# Patient Record
Sex: Female | Born: 1985 | Race: White | Hispanic: No | Marital: Married | Smoking: Former smoker
Health system: Southern US, Community
[De-identification: ages and names within clinical notes are randomized; demographics above are authoritative.]

## PROBLEM LIST (undated history)

## (undated) ENCOUNTER — Inpatient Hospital Stay: Payer: Self-pay

## (undated) DIAGNOSIS — F410 Panic disorder [episodic paroxysmal anxiety] without agoraphobia: Secondary | ICD-10-CM

## (undated) DIAGNOSIS — Z8614 Personal history of Methicillin resistant Staphylococcus aureus infection: Secondary | ICD-10-CM

## (undated) DIAGNOSIS — G40A09 Absence epileptic syndrome, not intractable, without status epilepticus: Secondary | ICD-10-CM

## (undated) DIAGNOSIS — R569 Unspecified convulsions: Secondary | ICD-10-CM

## (undated) DIAGNOSIS — K219 Gastro-esophageal reflux disease without esophagitis: Secondary | ICD-10-CM

## (undated) DIAGNOSIS — Z8659 Personal history of other mental and behavioral disorders: Secondary | ICD-10-CM

## (undated) DIAGNOSIS — F909 Attention-deficit hyperactivity disorder, unspecified type: Secondary | ICD-10-CM

## (undated) DIAGNOSIS — D649 Anemia, unspecified: Secondary | ICD-10-CM

## (undated) HISTORY — PX: OVARIAN CYST REMOVAL: SHX89

## (undated) HISTORY — PX: NO PAST SURGERIES: SHX2092

---
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
## 2006-01-04 ENCOUNTER — Emergency Department: Payer: Self-pay | Admitting: Emergency Medicine
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
## 2007-10-06 ENCOUNTER — Emergency Department: Payer: Self-pay | Admitting: Emergency Medicine

## 2010-08-15 DIAGNOSIS — Z8614 Personal history of Methicillin resistant Staphylococcus aureus infection: Secondary | ICD-10-CM

## 2010-08-15 HISTORY — DX: Personal history of Methicillin resistant Staphylococcus aureus infection: Z86.14

## 2011-12-04 ENCOUNTER — Emergency Department: Payer: Self-pay | Admitting: Emergency Medicine

## 2012-01-23 ENCOUNTER — Ambulatory Visit: Payer: Self-pay | Admitting: Obstetrics and Gynecology

## 2012-07-27 IMAGING — US ULTRASOUND RIGHT BREAST
1 series · 14 of 14 positions shown · non-contrast
Comparison: none

REASON FOR EXAM: RT BR LUMP 9 OCLOCK
COMMENTS:

[Series 1: ultrasound right breast · 0.08mm/px · 14 of 14 slices shown]
[im 1/14]
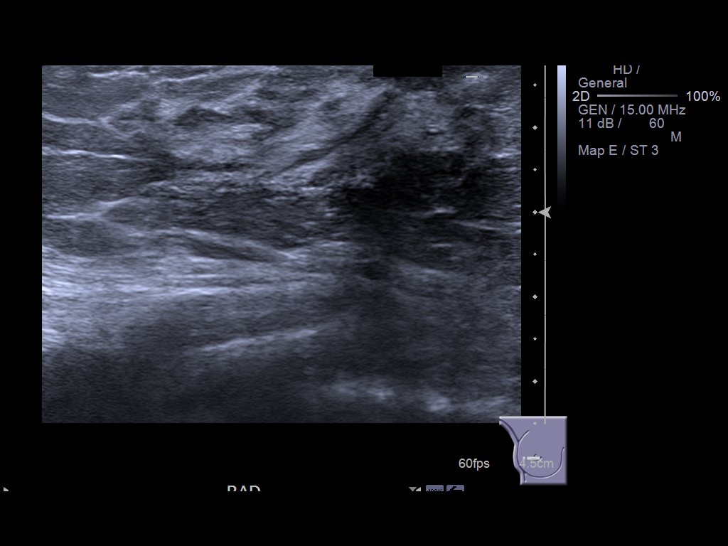
[im 2/14]
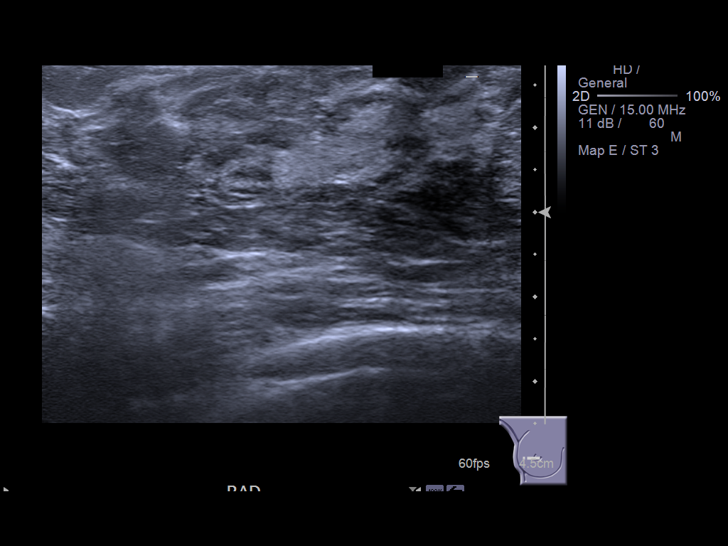
[im 3/14]
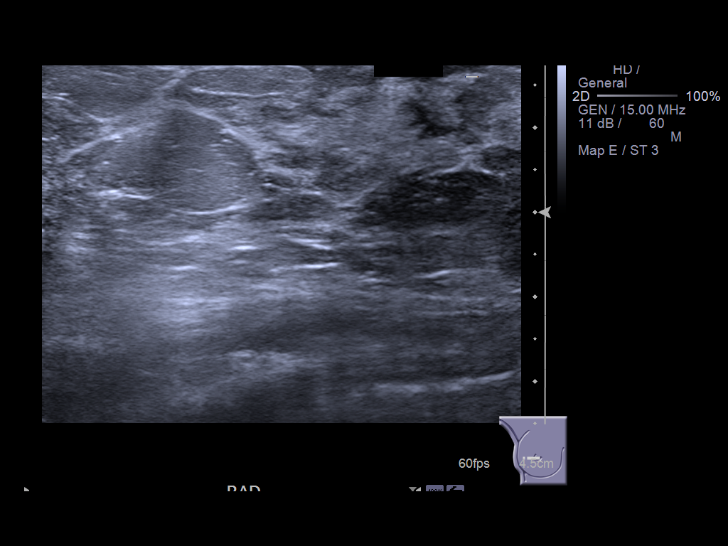
[im 4/14]
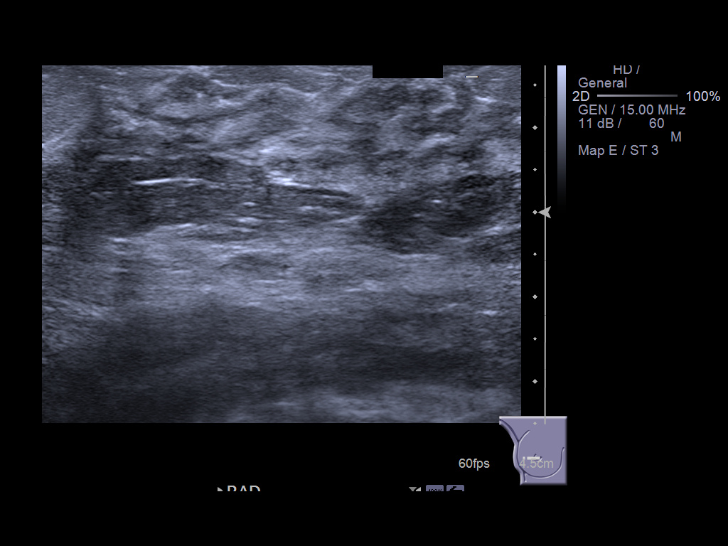
[im 5/14]
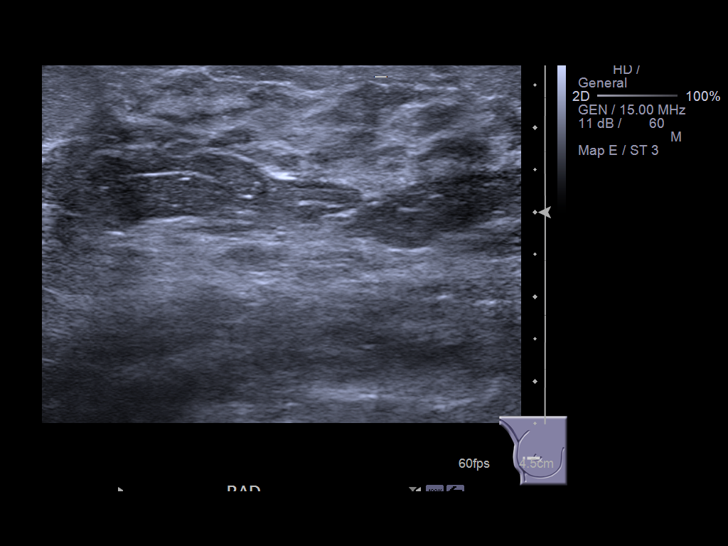
[im 6/14]
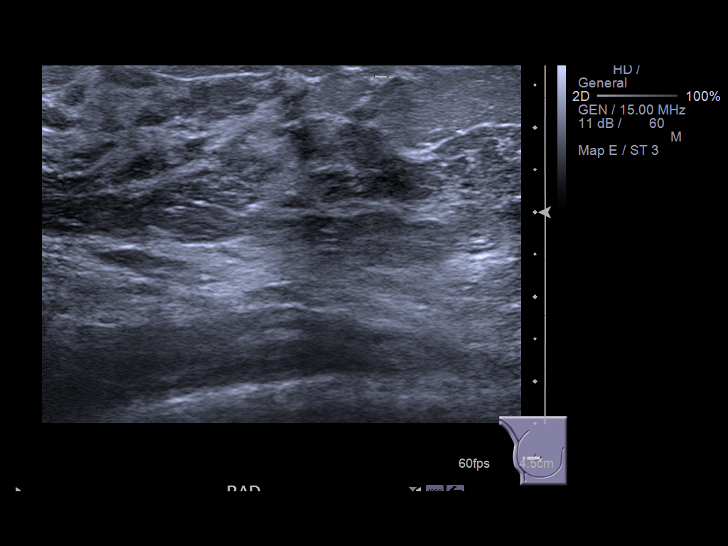
[im 7/14]
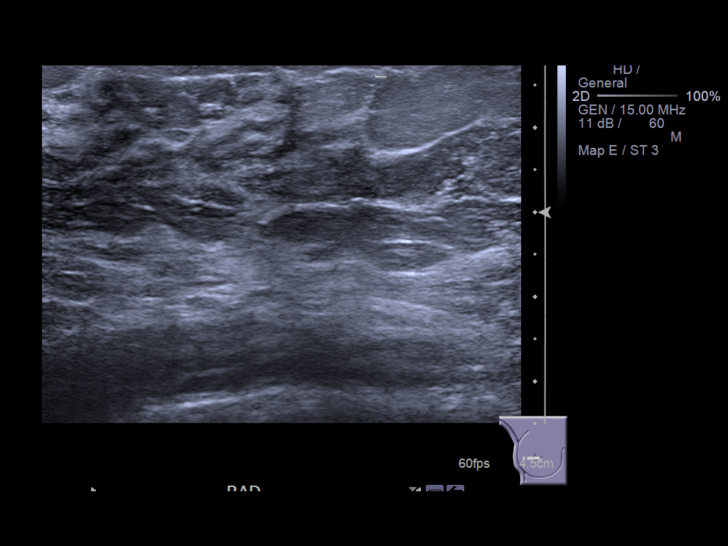
[im 8/14]
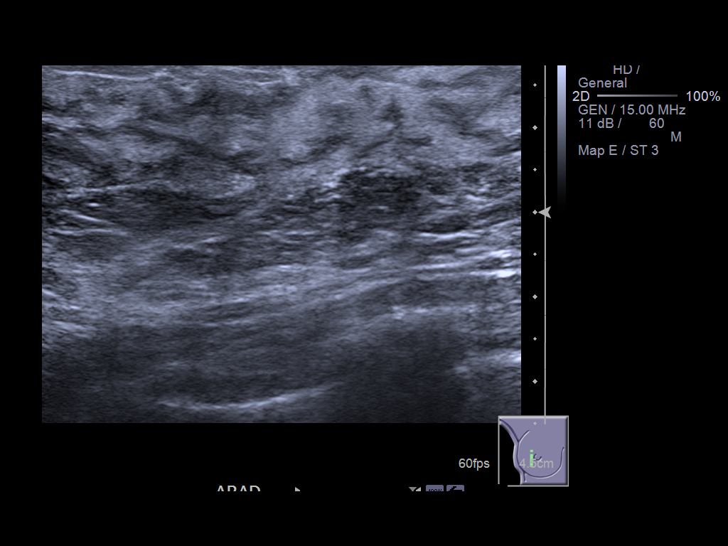
[im 9/14]
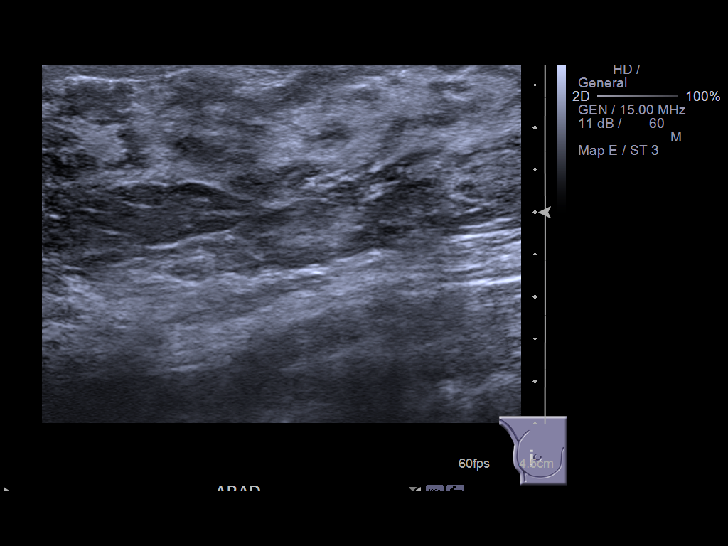
[im 10/14]
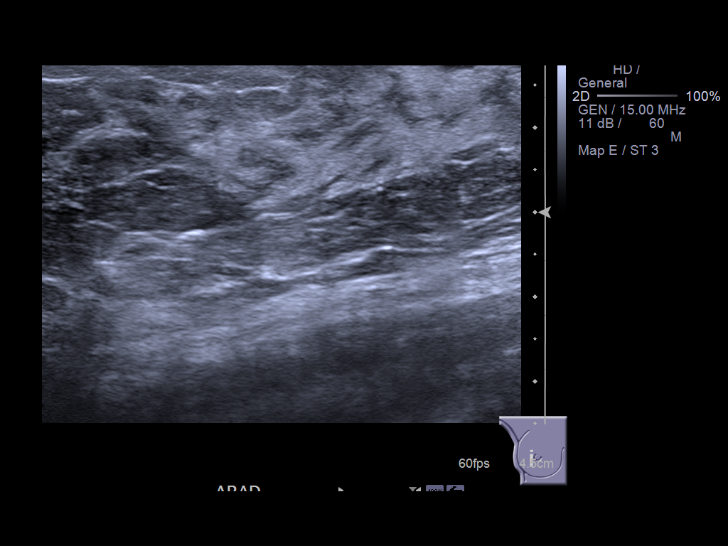
[im 11/14]
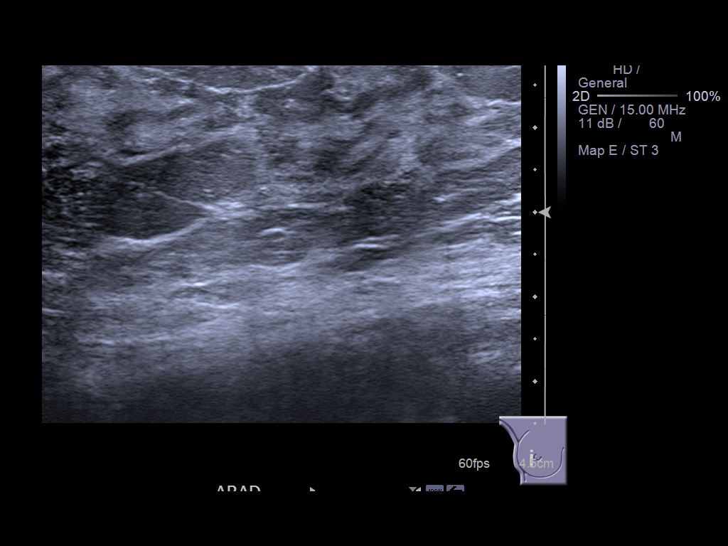
[im 12/14]
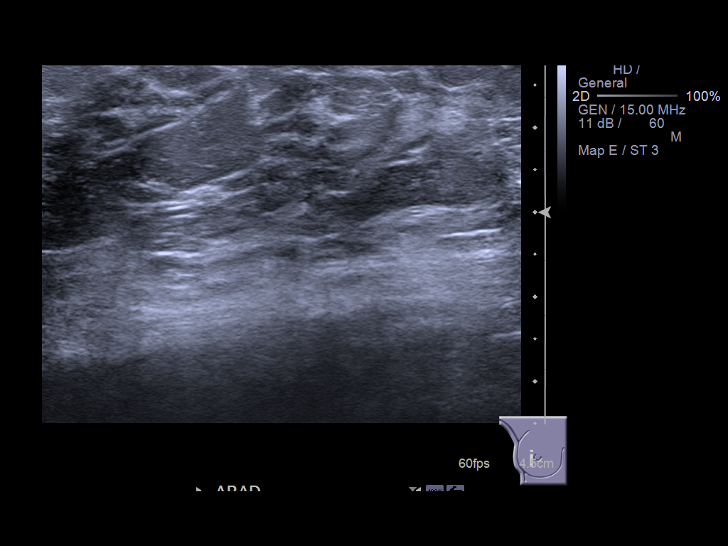
[im 13/14]
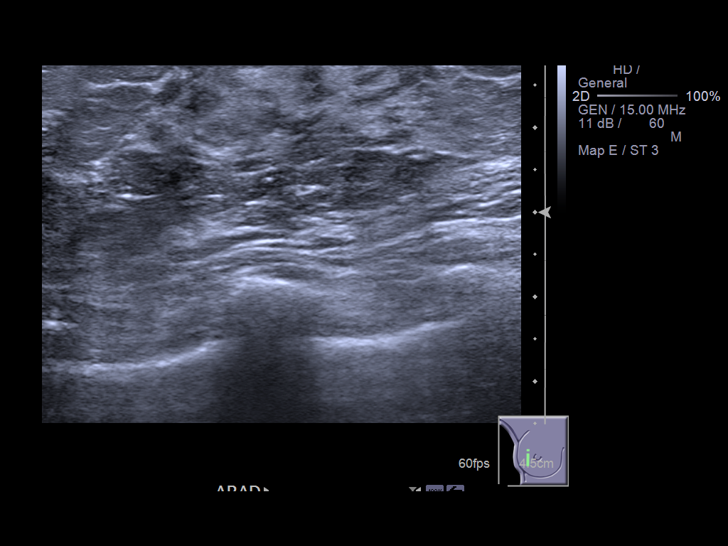
[im 14/14]
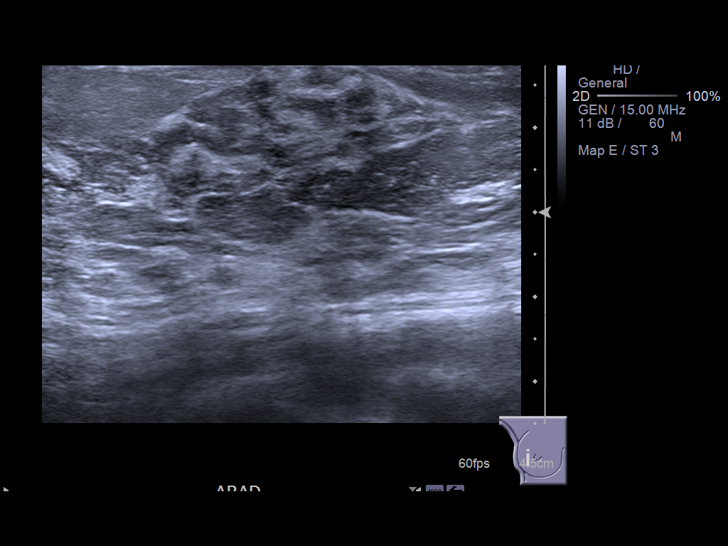

[14 of 14 positions shown; findings below may reference images not displayed]

PROCEDURE:     US  - US BREAST RIGHT  - January 23, 2012 [DATE]

RESULT:     Comparison: None.

Technique and findings: Multiple grayscale images were obtained of the
lateral right breast at approximately [DATE] secondary to reported palpable
amount in this region. There is some dense fibroglandular tissue in this
region. However, no mass or suspicious shadowing is identified.
IMPRESSION: BIRADS 1. Negative. Recommend further evaluation and management of the
patient's reported palpable abnormality be based and clinical grounds.

## 2014-06-16 LAB — OB RESULTS CONSOLE RUBELLA ANTIBODY, IGM: RUBELLA: IMMUNE

## 2014-06-16 LAB — OB RESULTS CONSOLE VARICELLA ZOSTER ANTIBODY, IGG: Varicella: IMMUNE

## 2014-06-16 LAB — OB RESULTS CONSOLE GC/CHLAMYDIA
Chlamydia: NEGATIVE
Gonorrhea: NEGATIVE

## 2014-06-16 LAB — OB RESULTS CONSOLE RPR: RPR: NONREACTIVE

## 2014-06-16 LAB — OB RESULTS CONSOLE HEPATITIS B SURFACE ANTIGEN: Hepatitis B Surface Ag: NEGATIVE

## 2014-06-16 LAB — OB RESULTS CONSOLE HIV ANTIBODY (ROUTINE TESTING): HIV: NONREACTIVE

## 2014-07-28 ENCOUNTER — Encounter: Payer: Self-pay | Admitting: Obstetrics and Gynecology

## 2014-08-15 NOTE — L&D Delivery Note (Signed)
Delivery Note At 3:09 AM a viable female was delivered via Vaginal, Spontaneous Delivery (Vertex).  APGAR: 8, 9; weight  8#9oz  Placenta status: delivered intact spontaneously  Cord: 3 vessels with the following complications: .  Cord pH: not needed  Anesthesia:  None Episiotomy:  None Lacerations:  2nd degree, periurethral Suture Repair: 2.0 vicryl Est. Blood Loss (mL):  300 cc  Mom to postpartum.  Baby to Couplet care / Skin to Skin.  Katie Marks 01/29/2015, 4:14 AM

## 2014-10-13 ENCOUNTER — Ambulatory Visit: Payer: Self-pay | Admitting: Obstetrics and Gynecology

## 2014-10-14 ENCOUNTER — Ambulatory Visit
Admit: 2014-10-14 | Disposition: A | Discharge status: Home / Self Care | Payer: Self-pay | Attending: Obstetrics and Gynecology | Admitting: Obstetrics and Gynecology

## 2014-11-14 ENCOUNTER — Ambulatory Visit
Admit: 2014-11-14 | Disposition: A | Discharge status: Home / Self Care | Payer: Self-pay | Attending: Obstetrics and Gynecology | Admitting: Obstetrics and Gynecology

## 2014-12-06 NOTE — Consult Note (Signed)
Referral Information:  Reason for Referral h/o seizure disorder, ADHD  now pregnant   Referring Physician [facility]   Prenatal Hx 28 yo G1 P0 at 13 5/7 weeks LMP 04/24/14 with EDC of 01/29/2015 - met with the genetic counselor -pt has been off of meds, tegretol, since age 16 , has occasionally pauses in her speech that she considers neurological but no recent sz activity . She is able to drive . Not using methylphenidate for ADHD. Feels liek she is doing well overall.   Past Obstetrical Hx Primigravida   Home Medications:  Medication Instructions Status  Prenatal Multivitamins Prenatal Multivitamins oral tablet 1 tab(s) orally once a day Active   Allergies:   No Known Allergies:   Vital Signs/Notes:  Nursing Vital Signs: **Vital Signs.:   14-Dec-15 08:54  Vital Signs Type Routine  Temperature Temperature (F) 99.5  Celsius 37.5  Temperature Source oral  Pulse Pulse 96  Respirations Respirations 18  Systolic BP Systolic BP 108  Diastolic BP (mmHg) Diastolic BP (mmHg) 64  Mean BP 78  Pulse Ox % Pulse Ox % 100  Pulse Ox Activity Level  At rest  Oxygen Delivery Room Air/ 21 %   Perinatal Consult:  LMP 24-Apr-2014   Past Medical History cont'd - Seizure disorder - stoppe dmeds at age 16 - "pauses " in speech no LOC . off tegretol . Dismissed by neurologist  - ADHD - got Rx for meds never took   Soc Hx married   Impression/Recommendations:  Impression IUP at 13  5/7  desires genetic screening h/o seizure d/o off meds x years  h/o ADHD no meds   Recommendations pregnancy too large for First tri screen ( CRL 87.9 exceeds 84 mm cutoff) pt will get tetra screen at 16 weeks, Anatomy scan at 18 weeks at [facility]  If her neuro sxs increase we are happy t o see her again  Given no grand mal szs and no meds we were reassuring to her.   Plan:  Genetic Counseling yes, today    Total Time Spent with Patient 5 minutes no charge   Electronic Signatures: Livingston,  Elizabeth Gresham (MD)  (Signed 14-Dec-15 11:50)  Authored: Referral, Home Medications, Allergies, Vital Signs/Notes, Consult, Impression, Plan, Billing   Last Updated: 14-Dec-15 11:50 by Livingston, Elizabeth Gresham (MD) 

## 2014-12-24 ENCOUNTER — Encounter: Payer: Self-pay | Admitting: *Deleted

## 2014-12-24 ENCOUNTER — Inpatient Hospital Stay
Admission: RE | Admit: 2014-12-24 | Discharge: 2014-12-24 | Disposition: A | Discharge status: Home / Self Care | Payer: No Typology Code available for payment source | Attending: Obstetrics and Gynecology | Admitting: Obstetrics and Gynecology

## 2014-12-24 DIAGNOSIS — O26893 Other specified pregnancy related conditions, third trimester: Secondary | ICD-10-CM | POA: Diagnosis not present

## 2014-12-24 DIAGNOSIS — M545 Low back pain, unspecified: Secondary | ICD-10-CM

## 2014-12-24 HISTORY — DX: Unspecified convulsions: R56.9

## 2014-12-24 NOTE — Progress Notes (Signed)
Discharge teaching completed.  Verbalized understanding.  Discharged in stable and ambulatory condition.   Camelia PhenesHall,Miyani Cronic F, RN

## 2014-12-24 NOTE — OB Triage Note (Signed)
Recvd to labor and delivery complaining of back pain that started yesterday when her hip popped at work.  This morning she felt back pain in the middle of her back that radiates to her leg.

## 2014-12-24 NOTE — OB Triage Provider Note (Signed)
  History     CSN: [id number]  Arrival date and time: 12/24/14 1129   Chief Complaint  Patient presents with  . Back Pain   HPI Comments: Pain began when she twisted yesterday.  Back Pain This is a new problem. The current episode started yesterday. The problem occurs intermittently. The problem has been gradually worsening since onset. The pain is present in the lumbar spine. The quality of the pain is described as shooting. The pain radiates to the right thigh. The pain is moderate. The pain is worse during the day. The symptoms are aggravated by standing and twisting. Stiffness is present at night. Risk factors include pregnancy. She has tried nothing for the symptoms. The treatment provided no relief.    OB History    Gravida Para Term Preterm AB TAB SAB Ectopic Multiple Living   1               Past Medical History  Diagnosis Date  . Seizures     Past Surgical History  Procedure Laterality Date  . No past surgeries      No family history on file.  History  Substance Use Topics  . Smoking status: Never Smoker   . Smokeless tobacco: Not on file  . Alcohol Use: No    Allergies: No Known Allergies  Prescriptions prior to admission  Medication Sig Dispense Refill Last Dose  . Prenatal Vit-Fe Fumarate-FA (PRENATAL MULTIVITAMIN) TABS tablet Take 1 tablet by mouth daily at 12 noon.       Review of Systems  Musculoskeletal: Positive for back pain.       No bowel or bladder sx. No muscle weakness in bilateral lower extremities. No tingling. It does shoot down right leg to mid-thigh.   Physical Exam   Blood pressure 130/76, pulse 84, temperature 98.5 F (36.9 C), temperature source Oral, resp. rate 16, height 5\' 7"  (1.702 m), weight 104.327 kg (230 lb).  Physical Exam  Musculoskeletal:       Right hip: She exhibits normal range of motion, normal strength and no swelling.       Left hip: She exhibits normal range of motion, normal strength, no tenderness and no  bony tenderness.       Right knee: Normal.       Left knee: Normal. She exhibits normal range of motion. No tenderness found.       Right ankle: Normal.       Left ankle: Normal.       Lumbar back: She exhibits tenderness and spasm. She exhibits no bony tenderness, no swelling and no edema.  Neurological:  Reflex Scores:      Patellar reflexes are 1+ on the right side and 1+ on the left side.      Achilles reflexes are 1+ on the right side and 1+ on the left side.   MAU Course  Procedures - None Assessment and Plan  Back pain in pregnancy: Conservative management; no evidence of fracture or discopathy on physical exam. Referral to physiatry, physical therapy and acupuncture offered.  Fetal testing- Reassuring. Reactive NST.  Christeen DouglasBEASLEY, Katie Marks 12/24/2014, 2:28 PM

## 2014-12-24 NOTE — Discharge Instructions (Signed)
Sciatica Sciatica happens when a large nerve, which runs through the joint between the tail bone and the hip bone, is compressed. This can cause shooting pain down the leg and can happen on either side of your body. Sometimes, it may cause weakness and can get worse as pregnancy progresses. The following comfort measures may help you manage the pain:  o Apply ice to the joint in the low back, located at the dimples on either side of the spine. o Ask your provider for instructions for stretches called pelvic tilt exercises. To help prevent sciatica: o Use good posture and lower back support while sitting o Wear shoes with good arch support o Try not to carry heavy bags on the same shoulder all of the time o Practice exercises that strengthen your core or stomach muscles, including yoga, swimming and pelvic tilt exercises.

## 2015-01-19 LAB — OB RESULTS CONSOLE GBS: STREP GROUP B AG: NEGATIVE

## 2015-01-19 LAB — OB RESULTS CONSOLE ANTIBODY SCREEN: ANTIBODY SCREEN: NEGATIVE

## 2015-01-28 ENCOUNTER — Encounter: Payer: Self-pay | Admitting: *Deleted

## 2015-01-28 ENCOUNTER — Inpatient Hospital Stay
Admission: EM | Admit: 2015-01-28 | Discharge: 2015-01-31 | DRG: 775 | Disposition: A | Discharge status: Home / Self Care | Payer: No Typology Code available for payment source | Attending: Obstetrics and Gynecology | Admitting: Obstetrics and Gynecology

## 2015-01-28 DIAGNOSIS — O26893 Other specified pregnancy related conditions, third trimester: Secondary | ICD-10-CM

## 2015-01-28 DIAGNOSIS — Z23 Encounter for immunization: Secondary | ICD-10-CM

## 2015-01-28 DIAGNOSIS — M545 Low back pain: Secondary | ICD-10-CM

## 2015-01-28 DIAGNOSIS — Z3A40 40 weeks gestation of pregnancy: Secondary | ICD-10-CM | POA: Diagnosis present

## 2015-01-28 DIAGNOSIS — Z3483 Encounter for supervision of other normal pregnancy, third trimester: Secondary | ICD-10-CM | POA: Diagnosis present

## 2015-01-28 LAB — TYPE AND SCREEN
ABO/RH(D): A POS
Antibody Screen: NEGATIVE

## 2015-01-28 LAB — OB RESULTS CONSOLE ABO/RH: RH Type: POSITIVE

## 2015-01-28 LAB — CBC
HCT: 32.6 % — ABNORMAL LOW (ref 35.0–47.0)
Hemoglobin: 10.9 g/dL — ABNORMAL LOW (ref 12.0–16.0)
MCH: 28.2 pg (ref 26.0–34.0)
MCHC: 33.4 g/dL (ref 32.0–36.0)
MCV: 84.4 fL (ref 80.0–100.0)
Platelets: 189 10*3/uL (ref 150–440)
RBC: 3.86 MIL/uL (ref 3.80–5.20)
RDW: 15.1 % — AB (ref 11.5–14.5)
WBC: 8.2 10*3/uL (ref 3.6–11.0)

## 2015-01-28 LAB — ABO/RH: ABO/RH(D): A POS

## 2015-01-28 MED ORDER — OXYTOCIN 40 UNITS IN LACTATED RINGERS INFUSION - SIMPLE MED
1.0000 m[IU]/min | INTRAVENOUS | Status: DC
  Administered 2015-01-28: 8 m[IU]/min via INTRAVENOUS
  Administered 2015-01-28: 1 m[IU]/min via INTRAVENOUS
  Administered 2015-01-28: 2 m[IU]/min via INTRAVENOUS
  Administered 2015-01-28: 4 m[IU]/min via INTRAVENOUS
  Administered 2015-01-28: 5 m[IU]/min via INTRAVENOUS
  Administered 2015-01-28: 18 m[IU]/min via INTRAVENOUS

## 2015-01-28 MED ORDER — LACTATED RINGERS IV SOLN: 500.0000 mL | INTRAVENOUS | Status: DC | PRN

## 2015-01-28 MED ORDER — LIDOCAINE HCL (PF) 1 % IJ SOLN
30.0000 mL | INTRAMUSCULAR | Status: AC | PRN
  Administered 2015-01-29: 30 mL via SUBCUTANEOUS
  Filled 2015-01-28: qty 30

## 2015-01-28 MED ORDER — OXYTOCIN BOLUS FROM INFUSION
500.0000 mL | INTRAVENOUS | Status: DC
  Administered 2015-01-29: 500 mL via INTRAVENOUS

## 2015-01-28 MED ORDER — OXYTOCIN 40 UNITS IN LACTATED RINGERS INFUSION - SIMPLE MED
INTRAVENOUS | Status: AC
  Administered 2015-01-28: 1 m[IU]/min via INTRAVENOUS
  Filled 2015-01-28: qty 1000

## 2015-01-28 MED ORDER — OXYTOCIN 40 UNITS IN LACTATED RINGERS INFUSION - SIMPLE MED
62.5000 mL/h | INTRAVENOUS | Status: DC
  Administered 2015-01-29: 62.5 mL/h via INTRAVENOUS

## 2015-01-28 MED ORDER — LACTATED RINGERS IV SOLN
INTRAVENOUS | Status: DC
  Administered 2015-01-28 – 2015-01-29 (×3): via INTRAVENOUS

## 2015-01-28 MED ORDER — TERBUTALINE SULFATE 1 MG/ML IJ SOLN: 0.2500 mg | Freq: Once | INTRAMUSCULAR | Status: AC | PRN

## 2015-01-28 NOTE — H&P (Signed)
Katie Marks is a 29 y.o. female presenting for in. Maternal Medical History:  Reason for admission: Nausea.    OB History    Gravida Para Term Preterm AB TAB SAB Ectopic Multiple Living   1              Past Medical History  Diagnosis Date  . Seizures    Past Surgical History  Procedure Laterality Date  . No past surgeries     Family History: family history is not on file. Social History:  reports that she has never smoked. She does not have any smokeless tobacco history on file. She reports that she does not drink alcohol or use illicit drugs.   Prenatal Transfer Tool  Maternal Diabetes: No Genetic Screening: Declined Maternal Ultrasounds/Referrals: Normal Fetal Ultrasounds or other Referrals:  None Maternal Substance Abuse:  No Significant Maternal Medications:  None Significant Maternal Lab Results:  None Other Comments:  hx of petit mal seizures  Review of Systems  Constitutional: Negative for fever and chills.  Eyes: Negative for blurred vision and double vision.  Respiratory: Negative for shortness of breath.   Cardiovascular: Negative for chest pain and palpitations.  Gastrointestinal: Negative for nausea, vomiting, abdominal pain, diarrhea and constipation.  Genitourinary: Negative for dysuria, urgency, frequency and flank pain.  Neurological: Negative for headaches.  Psychiatric/Behavioral: Negative for depression.    Dilation: 4 Effacement (%): 60 Station: -2 Exam by:: Zerita Boers, RN Blood pressure 115/74, pulse 78, temperature 98.2 F (36.8 C), temperature source Oral, resp. rate 18, height 5\' 7"  (1.702 m), weight 106.142 kg (234 lb), last menstrual period 04/24/2014. Maternal Exam:  Uterine Assessment: Contraction strength is mild.  Contraction frequency is regular.   Abdomen: Patient reports no abdominal tenderness. Fetal presentation: vertex  Introitus: Normal vulva. Normal vagina.  Pelvis: adequate for delivery.   Cervix: Cervix evaluated by  digital exam.     Physical Exam  Prenatal labs: ABO, Rh: --/--/A POS (06/15 1012) Antibody: NEG (06/15 1012) Rubella: Immune (11/02 0000) RPR: Nonreactive (11/02 0000)  HBsAg: Negative (11/02 0000)  HIV: Non-reactive (11/02 0000)  GBS: Negative (06/06 0000)   Assessment/Plan: Pitocin titration Will AROM when clinically stable Planning for no epidural  Confirmed vtx by ultrasound  [name] 01/28/2015, 6:01 PM

## 2015-01-29 ENCOUNTER — Encounter: Payer: Self-pay | Admitting: *Deleted

## 2015-01-29 LAB — CBC
HCT: 30.2 % — ABNORMAL LOW (ref 35.0–47.0)
HEMOGLOBIN: 10.1 g/dL — AB (ref 12.0–16.0)
MCH: 28.1 pg (ref 26.0–34.0)
MCHC: 33.6 g/dL (ref 32.0–36.0)
MCV: 83.7 fL (ref 80.0–100.0)
Platelets: 175 10*3/uL (ref 150–440)
RBC: 3.6 MIL/uL — ABNORMAL LOW (ref 3.80–5.20)
RDW: 15.2 % — AB (ref 11.5–14.5)
WBC: 14 10*3/uL — AB (ref 3.6–11.0)

## 2015-01-29 LAB — RPR: RPR: NONREACTIVE

## 2015-01-29 MED ORDER — IBUPROFEN 600 MG PO TABS
600.0000 mg | ORAL_TABLET | Freq: Four times a day (QID) | ORAL | Status: DC
  Administered 2015-01-29 – 2015-01-31 (×9): 600 mg via ORAL
  Filled 2015-01-29 (×8): qty 1

## 2015-01-29 MED ORDER — LIDOCAINE HCL (PF) 1 % IJ SOLN
INTRAMUSCULAR | Status: AC
  Administered 2015-01-29: 30 mL via SUBCUTANEOUS
  Filled 2015-01-29: qty 30

## 2015-01-29 MED ORDER — IBUPROFEN 600 MG PO TABS
ORAL_TABLET | ORAL | Status: AC
  Administered 2015-01-29: 600 mg via ORAL
  Filled 2015-01-29: qty 1

## 2015-01-29 MED ORDER — DIBUCAINE 1 % RE OINT: 1.0000 "application " | TOPICAL_OINTMENT | RECTAL | Status: DC | PRN

## 2015-01-29 MED ORDER — ACETAMINOPHEN 325 MG PO TABS
ORAL_TABLET | ORAL | Status: AC
  Administered 2015-01-29: 650 mg via ORAL
  Filled 2015-01-29: qty 2

## 2015-01-29 MED ORDER — BENZOCAINE-MENTHOL 20-0.5 % EX AERO
INHALATION_SPRAY | CUTANEOUS | Status: AC
  Administered 2015-01-29: 1 via TOPICAL
  Filled 2015-01-29: qty 56

## 2015-01-29 MED ORDER — MORPHINE SULFATE 4 MG/ML IJ SOLN
4.0000 mg | Freq: Once | INTRAMUSCULAR | Status: AC
Start: 2015-01-29 — End: 2015-01-29
  Administered 2015-01-29: 4 mg via INTRAVENOUS

## 2015-01-29 MED ORDER — ONDANSETRON HCL 4 MG PO TABS: 4.0000 mg | ORAL_TABLET | ORAL | Status: DC | PRN

## 2015-01-29 MED ORDER — MISOPROSTOL 200 MCG PO TABS
ORAL_TABLET | ORAL | Status: AC
  Filled 2015-01-29: qty 4

## 2015-01-29 MED ORDER — BUTORPHANOL TARTRATE 1 MG/ML IJ SOLN
INTRAMUSCULAR | Status: AC
  Administered 2015-01-29: 1 mg via INTRAVENOUS
  Filled 2015-01-29: qty 1

## 2015-01-29 MED ORDER — LANOLIN HYDROUS EX OINT: TOPICAL_OINTMENT | CUTANEOUS | Status: DC | PRN

## 2015-01-29 MED ORDER — OXYTOCIN 10 UNIT/ML IJ SOLN
INTRAMUSCULAR | Status: AC
  Filled 2015-01-29: qty 2

## 2015-01-29 MED ORDER — AMMONIA AROMATIC IN INHA
RESPIRATORY_TRACT | Status: AC
  Filled 2015-01-29: qty 10

## 2015-01-29 MED ORDER — BENZOCAINE-MENTHOL 20-0.5 % EX AERO
1.0000 "application " | INHALATION_SPRAY | CUTANEOUS | Status: DC | PRN
  Administered 2015-01-29: 1 via TOPICAL

## 2015-01-29 MED ORDER — SENNOSIDES-DOCUSATE SODIUM 8.6-50 MG PO TABS
2.0000 | ORAL_TABLET | ORAL | Status: DC
  Administered 2015-01-30 – 2015-01-31 (×2): 2 via ORAL
  Filled 2015-01-29 (×2): qty 2

## 2015-01-29 MED ORDER — ACETAMINOPHEN 325 MG PO TABS
650.0000 mg | ORAL_TABLET | ORAL | Status: DC | PRN
  Administered 2015-01-29: 650 mg via ORAL

## 2015-01-29 MED ORDER — WITCH HAZEL-GLYCERIN EX PADS: 1.0000 "application " | MEDICATED_PAD | CUTANEOUS | Status: DC | PRN

## 2015-01-29 MED ORDER — MORPHINE SULFATE 4 MG/ML IJ SOLN
INTRAMUSCULAR | Status: AC
  Administered 2015-01-29: 4 mg via INTRAVENOUS
  Filled 2015-01-29: qty 1

## 2015-01-29 MED ORDER — PRENATAL MULTIVITAMIN CH
1.0000 | ORAL_TABLET | Freq: Every day | ORAL | Status: DC
  Administered 2015-01-29 – 2015-01-31 (×3): 1 via ORAL
  Filled 2015-01-29 (×4): qty 1

## 2015-01-29 MED ORDER — OXYTOCIN 40 UNITS IN LACTATED RINGERS INFUSION - SIMPLE MED: 125.0000 mL/h | INTRAVENOUS | Status: DC | PRN

## 2015-01-29 MED ORDER — ONDANSETRON HCL 4 MG/2ML IJ SOLN: 4.0000 mg | INTRAMUSCULAR | Status: DC | PRN

## 2015-01-29 MED ORDER — OXYCODONE-ACETAMINOPHEN 5-325 MG PO TABS: 1.0000 | ORAL_TABLET | ORAL | Status: DC | PRN

## 2015-01-29 MED ORDER — DIPHENHYDRAMINE HCL 25 MG PO CAPS: 25.0000 mg | ORAL_CAPSULE | Freq: Four times a day (QID) | ORAL | Status: DC | PRN

## 2015-01-29 MED ORDER — TETANUS-DIPHTH-ACELL PERTUSSIS 5-2.5-18.5 LF-MCG/0.5 IM SUSP: 0.5000 mL | Freq: Once | INTRAMUSCULAR | Status: DC

## 2015-01-29 MED ORDER — SIMETHICONE 80 MG PO CHEW: 80.0000 mg | CHEWABLE_TABLET | ORAL | Status: DC | PRN

## 2015-01-29 MED ORDER — OXYCODONE-ACETAMINOPHEN 5-325 MG PO TABS: 2.0000 | ORAL_TABLET | ORAL | Status: DC | PRN

## 2015-01-29 MED ORDER — BUTORPHANOL TARTRATE 1 MG/ML IJ SOLN
1.0000 mg | INTRAMUSCULAR | Status: DC | PRN
  Administered 2015-01-29 (×2): 1 mg via INTRAVENOUS

## 2015-01-29 MED ORDER — ZOLPIDEM TARTRATE 5 MG PO TABS: 5.0000 mg | ORAL_TABLET | Freq: Every evening | ORAL | Status: DC | PRN

## 2015-01-30 NOTE — Progress Notes (Signed)
Post Partum Day 1 Subjective: no complaints  Objective: Blood pressure 104/66, pulse 73, temperature 98.4 F (36.9 C), temperature source Oral, resp. rate 18, height 5\' 7"  (1.702 m), weight 106.142 kg (234 lb), last menstrual period 04/24/2014, SpO2 98 %, unknown if currently breastfeeding.  Physical Exam:  General: alert, awake and oriented x 3 Heart: S1S2, RRR, No M/R/G Lungs: CTA bilat, No W/R/R. Lochia: appropriate Uterine Fundus: firm Lac: healing well DVT Evaluation: No evidence of DVT seen on physical exam.   Recent Labs  01/28/15 1012 01/29/15 0650  HGB 10.9* 10.1*  HCT 32.6* 30.2*    Assessment/Plan: Plan for discharge tomorrow   LOS: 2 days   Sharee Pimple 01/30/2015, 8:50 AM

## 2015-01-31 MED ORDER — IBUPROFEN 600 MG PO TABS
600.0000 mg | ORAL_TABLET | Freq: Once | ORAL | Status: AC
  Administered 2015-01-31: 600 mg via ORAL

## 2015-01-31 NOTE — Discharge Instructions (Addendum)
Discharged with mom via wc baby in arms

## 2015-01-31 NOTE — Discharge Summary (Signed)
Obstetric Discharge Summary Reason for Admission: onset of labor Prenatal Procedures: none Intrapartum Procedures: spontaneous vaginal delivery Postpartum Procedures: none Complications-Operative and Postpartum: none HEMOGLOBIN  Date Value Ref Range Status  01/29/2015 10.1* 12.0 - 16.0 g/dL Final   HCT  Date Value Ref Range Status  01/29/2015 30.2* 35.0 - 47.0 % Final    Physical Exam:  General: alert Lochia: appropriate Uterine Fundus: firm Incision: healing well DVT Evaluation: No evidence of DVT seen on physical exam.  Discharge Diagnoses: Term Pregnancy-delivered  Discharge Information: Date: 01/31/2015 Activity: unrestricted Diet: routine Medications: PNV Condition: stable Instructions: no driving x 2 weeks, no sex or tampons x 6 weeks Discharge to: home   Newborn Data: Live born female  Birth Weight:   APGAR: 8, 9  Home with mother.  Milon Score W 01/31/2015, 10:11 AM

## 2015-05-19 NOTE — H&P (Signed)
  [name] is a 29 y.o. female here for Follow-up (IUD) . IUD placement 03/27/15 with difficult placement . Pt with some pain post placement . I instructed the pt to use backup form of contraception given the difficult placement . U/S today fails to show IUP in the endometrium . Pt has had some intermittent shape pain that is short lived . KUB shows extrauterine ( intraabdominal position lower pelvis)   Past Medical History:  has a past medical history of Attention deficit hyperactivity disorder (ADHD) and Seizure, petit mal.  Past Surgical History:  has a past surgical history that includes Augmentation mammaplasty. Family History: family history includes No Known Problems in her mother. Social History:  reports that she has quit smoking. She does not have any smokeless tobacco history on file. She reports that she does not drink alcohol or use illicit drugs. OB/GYN History:  OB History    Gravida Para Term Preterm AB TAB SAB Ectopic Multiple Living   Allergies: has No Known Allergies. Medications:  Current Outpatient Prescriptions:  . methylphenidate (RITALIN) 20 MG tablet, TAKE 1 TABLET BY MOUTH IN THE MORNING, NOON AND 4PM, Disp: , Rfl: 0 . Compound Medication, Estriol /g Apply a small amt to affected area 3-4 times daily Disp 30 g tube Called to [facility]Edicap (Patient not taking: Reported on 04/28/2015 ), Disp: 1 each, Rfl: 0 . ferrous sulfate 325 (65 FE) MG tablet, Take 325 mg by mouth daily with breakfast., Disp: , Rfl:  . prenatal vit-iron fumarate-FA (PRENAVITE) tablet, Take 1 tablet by mouth once daily., Disp: , Rfl:   Review of Systems: General:   No fatigue or weight loss Eyes:   No vision changes Ears:   No hearing difficulty Respiratory:   No cough or shortness of breath Pulmonary:   No asthma or shortness of breath Cardiovascular:  No chest pain, palpitations, dyspnea on exertion Gastrointestinal:  No abdominal bloating,  chronic diarrhea, constipations, masses, pain or hematochezia Genitourinary:  No hematuria, dysuria, abnormal vaginal discharge, pelvic pain, Menometrorrhagia Lymphatic:  No swollen lymph nodes Musculoskeletal: No muscle weakness Neurologic:  No extremity weakness, syncope, seizure disorder Psychiatric:  No history of depression, delusions or suicidal/homicidal ideation   Exam:      Vitals:   04/28/15 1415  BP: 118/87  Pulse: 92    Body mass index is 33.77 kg/(m^2).  WDWN white/ female in NAD  Lungs: CTA  CV : RRR without murmur   Neck: no thyromegaly Abdomen: soft , no mass, normal active bowel sounds, non-tender, no rebound tenderness Pelvic: tanner stage 5 ,  External genitalia: vulva /labia no lesions Urethra: no prolapse Vagina: normal physiologic d/c Cervix: no lesions, no cervical motion tenderness  Uterus: normal size shape and contour, non-tender Adnexa: no mass, non-tender   Impression:   The primary encounter diagnosis was Malpositioned intrauterine device (IUD), initial encounter. A diagnosis of IUD strings lost was also pertinent to this visit.   intraabdominal position   Plan:   L/S removal of IUD

## 2015-05-20 DIAGNOSIS — R569 Unspecified convulsions: Secondary | ICD-10-CM

## 2015-05-20 HISTORY — DX: Unspecified convulsions: R56.9

## 2015-05-21 ENCOUNTER — Encounter
Admission: RE | Admit: 2015-05-21 | Discharge: 2015-05-21 | Disposition: A | Discharge status: Home / Self Care | Payer: No Typology Code available for payment source | Source: Ambulatory Visit | Attending: Obstetrics and Gynecology | Admitting: Obstetrics and Gynecology

## 2015-05-21 DIAGNOSIS — Z01818 Encounter for other preprocedural examination: Secondary | ICD-10-CM | POA: Insufficient documentation

## 2015-05-21 HISTORY — DX: Anemia, unspecified: D64.9

## 2015-05-21 HISTORY — DX: Personal history of other mental and behavioral disorders: Z86.59

## 2015-05-21 HISTORY — DX: Absence epileptic syndrome, not intractable, without status epilepticus: G40.A09

## 2015-05-21 HISTORY — DX: Gastro-esophageal reflux disease without esophagitis: K21.9

## 2015-05-21 HISTORY — DX: Attention-deficit hyperactivity disorder, unspecified type: F90.9

## 2015-05-21 LAB — BASIC METABOLIC PANEL
Anion gap: 7 (ref 5–15)
BUN: 6 mg/dL (ref 6–20)
CO2: 24 mmol/L (ref 22–32)
CREATININE: 0.8 mg/dL (ref 0.44–1.00)
Calcium: 9.2 mg/dL (ref 8.9–10.3)
Chloride: 108 mmol/L (ref 101–111)
GFR calc Af Amer: >60 mL/min (ref >60)
Glucose, Bld: 93 mg/dL (ref 65–99)
POTASSIUM: 4.2 mmol/L (ref 3.5–5.1)
SODIUM: 139 mmol/L (ref 135–145)

## 2015-05-21 LAB — CBC
HCT: 36 % (ref 35.0–47.0)
Hemoglobin: 11.8 g/dL — ABNORMAL LOW (ref 12.0–16.0)
MCH: 26.9 pg (ref 26.0–34.0)
MCHC: 32.9 g/dL (ref 32.0–36.0)
MCV: 81.7 fL (ref 80.0–100.0)
PLATELETS: 262 10*3/uL (ref 150–440)
RBC: 4.4 MIL/uL (ref 3.80–5.20)
RDW: 14.1 % (ref 11.5–14.5)
WBC: 6.7 10*3/uL (ref 3.6–11.0)

## 2015-05-21 LAB — SURGICAL PCR SCREEN
MRSA, PCR: NEGATIVE
STAPHYLOCOCCUS AUREUS: POSITIVE — AB

## 2015-05-21 LAB — TYPE AND SCREEN
ABO/RH(D): A POS
ANTIBODY SCREEN: NEGATIVE

## 2015-05-21 NOTE — Patient Instructions (Signed)
  Your procedure is scheduled on: May 25, 2015(Monday) Report to Day Surgery.[facility]) To find out your arrival time please call [phone] between 1PM - 3PM on May 22, 2015(Friday).  Remember: Instructions that are not followed completely may result in serious medical risk, up to and including death, or upon the discretion of your surgeon and anesthesiologist your surgery may need to be rescheduled.    __x__ 1. Do not eat food or drink liquids after midnight. No gum chewing or hard candies.     ____ 2. No Alcohol for 24 hours before or after surgery.   ____ 3. Bring all medications with you on the day of surgery if instructed.    __x__ 4. Notify your doctor if there is any change in your medical condition     (cold, fever, infections).     Do not wear jewelry, make-up, hairpins, clips or nail polish.  Do not wear lotions, powders, or perfumes. You may wear deodorant.  Do not shave 48 hours prior to surgery. Men may shave face and neck.  Do not bring valuables to the hospital.    [facility] is not responsible for any belongings or valuables.               Contacts, dentures or bridgework may not be worn into surgery.  Leave your suitcase in the car. After surgery it may be brought to your room.  For patients admitted to the hospital, discharge time is determined by your                treatment team.   Patients discharged the day of surgery will not be allowed to drive home.   Please read over the following fact sheets that you were given:   MRSA Information and Surgical Site Infection Prevention   ____ Take these medicines the morning of surgery with A SIP OF WATER:    1.   2.   3.   4.  5.  6.  ____ Fleet Enema (as directed)   __x__ Use CHG Soap as directed  ____ Use inhalers on the day of surgery  ____ Stop metformin 2 days prior to surgery    ____ Take 1/2 of usual insulin dose the night before surgery and none on the morning of surgery.   ____  Stop Coumadin/Plavix/aspirin on   ____ Stop Anti-inflammatories on    ____ Stop supplements until after surgery.    ____ Bring C-Pap to the hospital.

## 2015-05-21 NOTE — Pre-Procedure Instructions (Signed)
Dr. Feliberto Gottron office notified of positive staph results (spoke to Particia Jasper).

## 2015-05-25 ENCOUNTER — Ambulatory Visit
Admission: RE | Admit: 2015-05-25 | Discharge: 2015-05-25 | Disposition: A | Discharge status: Home / Self Care | Payer: No Typology Code available for payment source | Source: Ambulatory Visit | Attending: Obstetrics and Gynecology | Admitting: Obstetrics and Gynecology

## 2015-05-25 ENCOUNTER — Encounter
Admission: RE | Disposition: A | Discharge status: Home / Self Care | Payer: Self-pay | Source: Ambulatory Visit | Attending: Obstetrics and Gynecology

## 2015-05-25 ENCOUNTER — Ambulatory Visit: Payer: No Typology Code available for payment source | Admitting: Anesthesiology

## 2015-05-25 ENCOUNTER — Encounter: Payer: Self-pay | Admitting: *Deleted

## 2015-05-25 DIAGNOSIS — Z87891 Personal history of nicotine dependence: Secondary | ICD-10-CM | POA: Diagnosis not present

## 2015-05-25 DIAGNOSIS — R102 Pelvic and perineal pain: Secondary | ICD-10-CM | POA: Insufficient documentation

## 2015-05-25 DIAGNOSIS — Z79899 Other long term (current) drug therapy: Secondary | ICD-10-CM | POA: Insufficient documentation

## 2015-05-25 DIAGNOSIS — F909 Attention-deficit hyperactivity disorder, unspecified type: Secondary | ICD-10-CM | POA: Diagnosis not present

## 2015-05-25 DIAGNOSIS — T8389XA Other specified complication of genitourinary prosthetic devices, implants and grafts, initial encounter: Secondary | ICD-10-CM | POA: Diagnosis not present

## 2015-05-25 DIAGNOSIS — N83201 Unspecified ovarian cyst, right side: Secondary | ICD-10-CM | POA: Insufficient documentation

## 2015-05-25 DIAGNOSIS — X58XXXA Exposure to other specified factors, initial encounter: Secondary | ICD-10-CM | POA: Diagnosis not present

## 2015-05-25 DIAGNOSIS — G40409 Other generalized epilepsy and epileptic syndromes, not intractable, without status epilepticus: Secondary | ICD-10-CM | POA: Diagnosis not present

## 2015-05-25 DIAGNOSIS — N83202 Unspecified ovarian cyst, left side: Secondary | ICD-10-CM | POA: Insufficient documentation

## 2015-05-25 HISTORY — PX: LAPAROSCOPY: SHX197

## 2015-05-25 HISTORY — PX: IUD REMOVAL: SHX5392

## 2015-05-25 LAB — POCT PREGNANCY, URINE: PREG TEST UR: NEGATIVE

## 2015-05-25 SURGERY — LAPAROSCOPY, DIAGNOSTIC
Anesthesia: General

## 2015-05-25 MED ORDER — NAPROXEN 500 MG PO TABS: 500.0000 mg | ORAL_TABLET | Freq: Two times a day (BID) | ORAL | Status: DC

## 2015-05-25 MED ORDER — MIDAZOLAM HCL 2 MG/2ML IJ SOLN
INTRAMUSCULAR | Status: DC | PRN
  Administered 2015-05-25: 2 mg via INTRAVENOUS

## 2015-05-25 MED ORDER — FAMOTIDINE 20 MG PO TABS
20.0000 mg | ORAL_TABLET | Freq: Once | ORAL | Status: AC
  Administered 2015-05-25: 20 mg via ORAL

## 2015-05-25 MED ORDER — FENTANYL CITRATE (PF) 100 MCG/2ML IJ SOLN: 25.0000 ug | INTRAMUSCULAR | Status: DC | PRN

## 2015-05-25 MED ORDER — SUGAMMADEX SODIUM 500 MG/5ML IV SOLN: INTRAVENOUS | Status: DC | PRN

## 2015-05-25 MED ORDER — FAMOTIDINE 20 MG PO TABS
ORAL_TABLET | ORAL | Status: AC
  Filled 2015-05-25: qty 1

## 2015-05-25 MED ORDER — LACTATED RINGERS IV SOLN
INTRAVENOUS | Status: DC
  Administered 2015-05-25 (×3): via INTRAVENOUS

## 2015-05-25 MED ORDER — HYDROCODONE-ACETAMINOPHEN 5-325 MG PO TABS: 1.0000 | ORAL_TABLET | Freq: Four times a day (QID) | ORAL | Status: DC | PRN

## 2015-05-25 MED ORDER — ONDANSETRON HCL 4 MG/2ML IJ SOLN
INTRAMUSCULAR | Status: DC | PRN
  Administered 2015-05-25: 4 mg via INTRAVENOUS

## 2015-05-25 MED ORDER — KETOROLAC TROMETHAMINE 30 MG/ML IJ SOLN
INTRAMUSCULAR | Status: DC | PRN
  Administered 2015-05-25: 30 mg via INTRAVENOUS

## 2015-05-25 MED ORDER — BUPIVACAINE HCL 0.5 % IJ SOLN
INTRAMUSCULAR | Status: DC | PRN
  Administered 2015-05-25: 5 mL

## 2015-05-25 MED ORDER — FERRIC SUBSULFATE 259 MG/GM EX SOLN
CUTANEOUS | Status: AC
  Filled 2015-05-25: qty 8

## 2015-05-25 MED ORDER — SILVER NITRATE-POT NITRATE 75-25 % EX MISC
CUTANEOUS | Status: AC
  Filled 2015-05-25: qty 4

## 2015-05-25 MED ORDER — FENTANYL CITRATE (PF) 100 MCG/2ML IJ SOLN
INTRAMUSCULAR | Status: DC | PRN
  Administered 2015-05-25: 100 ug via INTRAVENOUS

## 2015-05-25 MED ORDER — DEXAMETHASONE SODIUM PHOSPHATE 4 MG/ML IJ SOLN
INTRAMUSCULAR | Status: DC | PRN
  Administered 2015-05-25: 10 mg via INTRAVENOUS

## 2015-05-25 MED ORDER — ROCURONIUM BROMIDE 100 MG/10ML IV SOLN
INTRAVENOUS | Status: DC | PRN
Start: 2015-05-25 — End: 2015-05-25
  Administered 2015-05-25: 40 mg via INTRAVENOUS
  Administered 2015-05-25: 10 mg via INTRAVENOUS

## 2015-05-25 MED ORDER — PROPOFOL 10 MG/ML IV BOLUS
INTRAVENOUS | Status: DC | PRN
  Administered 2015-05-25: 200 mg via INTRAVENOUS

## 2015-05-25 MED ORDER — SUGAMMADEX SODIUM 500 MG/5ML IV SOLN
INTRAVENOUS | Status: DC | PRN
  Administered 2015-05-25: 188.6 mg via INTRAVENOUS

## 2015-05-25 MED ORDER — LIDOCAINE HCL (CARDIAC) 20 MG/ML IV SOLN
INTRAVENOUS | Status: DC | PRN
  Administered 2015-05-25: 50 mg via INTRAVENOUS
  Administered 2015-05-25: 100 mg via INTRAVENOUS
  Administered 2015-05-25: 50 mg via INTRAVENOUS

## 2015-05-25 MED ORDER — SUCCINYLCHOLINE CHLORIDE 20 MG/ML IJ SOLN
INTRAMUSCULAR | Status: DC | PRN
  Administered 2015-05-25: 100 mg via INTRAVENOUS

## 2015-05-25 MED ORDER — BUPIVACAINE HCL (PF) 0.5 % IJ SOLN
INTRAMUSCULAR | Status: AC
  Filled 2015-05-25: qty 30

## 2015-05-25 MED ORDER — ONDANSETRON HCL 4 MG/2ML IJ SOLN: 4.0000 mg | Freq: Once | INTRAMUSCULAR | Status: DC | PRN

## 2015-05-25 SURGICAL SUPPLY — 40 items
APPLICATOR SWAB PROCTO LG 16IN (MISCELLANEOUS) ×3 IMPLANT
BLADE SURG SZ11 CARB STEEL (BLADE) ×3 IMPLANT
CANISTER SUCT 1200ML W/VALVE (MISCELLANEOUS) IMPLANT
CATH FOLEY 2WAY  5CC 16FR (CATHETERS)
CATH ROBINSON RED A/P 16FR (CATHETERS) ×3 IMPLANT
CATH URTH 16FR FL 2W BLN LF (CATHETERS) IMPLANT
CHLORAPREP W/TINT 26ML (MISCELLANEOUS) ×3 IMPLANT
CLOSURE WOUND 1/2 X4 (GAUZE/BANDAGES/DRESSINGS) ×1
DRSG TEGADERM 2-3/8X2-3/4 SM (GAUZE/BANDAGES/DRESSINGS) ×9 IMPLANT
GAUZE SPONGE NON-WVN 2X2 STRL (MISCELLANEOUS) ×2 IMPLANT
GLOVE BIO SURGEON STRL SZ8 (GLOVE) ×3 IMPLANT
GOWN STRL REUS W/ TWL LRG LVL3 (GOWN DISPOSABLE) ×1 IMPLANT
GOWN STRL REUS W/ TWL XL LVL3 (GOWN DISPOSABLE) ×1 IMPLANT
GOWN STRL REUS W/TWL LRG LVL3 (GOWN DISPOSABLE) ×2
GOWN STRL REUS W/TWL XL LVL3 (GOWN DISPOSABLE) ×2
IRRIGATION STRYKERFLOW (MISCELLANEOUS) ×1 IMPLANT
IRRIGATOR STRYKERFLOW (MISCELLANEOUS) ×3
IV NS 1000ML (IV SOLUTION)
IV NS 1000ML BAXH (IV SOLUTION) IMPLANT
KIT RM TURNOVER CYSTO AR (KITS) ×3 IMPLANT
LABEL OR SOLS (LABEL) ×3 IMPLANT
NS IRRIG 500ML POUR BTL (IV SOLUTION) ×3 IMPLANT
PACK DNC HYST (MISCELLANEOUS) ×3 IMPLANT
PACK GYN LAPAROSCOPIC (MISCELLANEOUS) ×3 IMPLANT
PAD OB MATERNITY 4.3X12.25 (PERSONAL CARE ITEMS) ×3 IMPLANT
PAD PREP 24X41 OB/GYN DISP (PERSONAL CARE ITEMS) ×3 IMPLANT
SCISSORS METZENBAUM CVD 33 (INSTRUMENTS) ×3 IMPLANT
SHEARS HARMONIC ACE PLUS 36CM (ENDOMECHANICALS) IMPLANT
SLEEVE ENDOPATH XCEL 5M (ENDOMECHANICALS) ×3 IMPLANT
SPONGE VERSALON 2X2 STRL (MISCELLANEOUS) ×4
STRIP CLOSURE SKIN 1/2X4 (GAUZE/BANDAGES/DRESSINGS) ×2 IMPLANT
SUT VIC AB 2-0 UR6 27 (SUTURE) ×3 IMPLANT
SUT VIC AB 4-0 SH 27 (SUTURE) ×4
SUT VIC AB 4-0 SH 27XANBCTRL (SUTURE) ×2 IMPLANT
SWABSTK COMLB BENZOIN TINCTURE (MISCELLANEOUS) ×3 IMPLANT
SYR 50ML LL SCALE MARK (SYRINGE) ×3 IMPLANT
TOWEL OR 17X26 4PK STRL BLUE (TOWEL DISPOSABLE) ×3 IMPLANT
TROCAR ENDO BLADELESS 11MM (ENDOMECHANICALS) ×3 IMPLANT
TROCAR XCEL NON-BLD 5MMX100MML (ENDOMECHANICALS) ×3 IMPLANT
TUBING INSUFFLATOR HI FLOW (MISCELLANEOUS) ×3 IMPLANT

## 2015-05-25 NOTE — Anesthesia Preprocedure Evaluation (Signed)
Anesthesia Evaluation  Patient identified by MRN, date of birth, ID band Patient awake    Reviewed: Allergy & Precautions, NPO status , Patient's Chart, lab work & pertinent test results  Airway Mallampati: II  TM Distance: >3 FB Neck ROM: Full    Dental no notable dental hx.    Pulmonary neg pulmonary ROS,    breath sounds clear to auscultation       Cardiovascular negative cardio ROS Normal cardiovascular exam     Neuro/Psych Seizures -, Well Controlled,  PSYCHIATRIC DISORDERS Anxiety ADHDOn no meds for seizures    GI/Hepatic Neg liver ROS, GERD  Medicated and Controlled,  Endo/Other  negative endocrine ROS  Renal/GU negative Renal ROS  negative genitourinary   Musculoskeletal negative musculoskeletal ROS (+)   Abdominal Normal abdominal exam  (+)   Peds negative pediatric ROS (+)  Hematology  (+) Blood dyscrasia, anemia ,   Anesthesia Other Findings   Reproductive/Obstetrics                             Anesthesia Physical Anesthesia Plan  ASA: II  Anesthesia Plan: General   Post-op Pain Management:    Induction: Intravenous  Airway Management Planned: Oral ETT  Additional Equipment:   Intra-op Plan:   Post-operative Plan: Extubation in OR  Informed Consent: I have reviewed the patients History and Physical, chart, labs and discussed the procedure including the risks, benefits and alternatives for the proposed anesthesia with the patient or authorized representative who has indicated his/her understanding and acceptance.   Dental advisory given  Plan Discussed with: Surgeon and CRNA  Anesthesia Plan Comments:         Anesthesia Quick Evaluation

## 2015-05-25 NOTE — Brief Op Note (Signed)
05/25/2015  12:53 PM  PATIENT:  Katie Marks  29 y.o. female  PRE-OPERATIVE DIAGNOSIS:  REMOVE IUD  POST-OPERATIVE DIAGNOSIS:  REMOVE IUD  PROCEDURE:  Procedure(s): LAPAROSCOPY DIAGNOSTIC (N/A) INTRAUTERINE DEVICE (IUD) REMOVAL, RIGHT OVARIAN CYSTOTOMY  (N/A)  SURGEON:  Surgeon(s) and Role:    Suzy Bouchard, MD - Primary  PHYSICIAN ASSISTANT:   ASSISTANTS: none   ANESTHESIA:   general  EBL: minimal  BLOOD ADMINISTERED:none  DRAINS: none   LOCAL MEDICATIONS USED:  LIDOCAINE   SPECIMEN:  No Specimen  DISPOSITION OF SPECIMEN:  N/A  COUNTS:  YES  TOURNIQUET:  * No tourniquets in log *  DICTATION: .Other Dictation: Dictation Number verbal  PLAN OF CARE: Discharge to home after PACU  PATIENT DISPOSITION:  PACU - hemodynamically stable.   Delay start of Pharmacological VTE agent (>24hrs) due to surgical blood loss or risk of bleeding: not applicable

## 2015-05-25 NOTE — Discharge Instructions (Signed)
Sugammadex injection What is this medicine? SUGAMMADEX (soo GAM ma dex) is used to reverse the effects of the muscle relaxants rocuronium and vecuronium. These drugs are given to patients during surgery. This medicine may be used for other purposes; ask your health care provider or pharmacist if you have questions. What should I tell my health care provider before I take this medicine? They need to know if you have any of these conditions: -bleeding disorders -kidney disease -slow heartbeat -an unusual or allergic reaction to sugammadex, other medicines, foods, dyes, or preservatives -pregnant or trying to get pregnant -breast-feeding How should I use this medicine? This medicine is for injection into a vein. It is given by a health care professional in a hospital or clinic setting. Overdosage: If you think you have taken too much of this medicine contact a poison control center or emergency room at once. NOTE: This medicine is only for you. Do not share this medicine with others. What if I miss a dose? This does not apply. What may interact with this medicine? This medicine may interact with the following medicines: -birth control pills, patches, rings, or injections -toremifene This list may not describe all possible interactions. Give your health care provider a list of all the medicines, herbs, non-prescription drugs, or dietary supplements you use. Also tell them if you smoke, drink alcohol, or use illegal drugs. Some items may interact with your medicine. What should I watch for while using this medicine? Your condition will be monitored carefully while you are receiving this medicine. Birth control pills, patches, rings, or injections may not work properly while you are taking this medicine. Females should use an additional, non-hormonal method of birth control (such as condoms or spermicidal jelly) for 7 days after receiving this medicine. What side effects may I notice from  receiving this medicine? Side effects that you should report to your doctor or health care professional as soon as possible: -allergic reaction like skin rash, itching or hives, swelling of the face, lips, or tongue -breathing problems -signs and symptoms of a dangerous change in heartbeat or heart rhythm like chest pain; dizziness; fast or irregular heartbeat; palpitations; feeling faint or lightheaded, falls; breathing problems -signs and symptoms of low blood pressure or a slow heartbeat like dizziness; feeling faint or lightheaded, falls; unusually weak or tired Side effects that usually do not require medical attention (Report these to your doctor or health care professional if they continue or are bothersome.): -anxious -headache -muscle pain -nausea -pain at the injection site -stomach pain -vomiting This list may not describe all possible side effects. Call your doctor for medical advice about side effects. You may report side effects to FDA at 1-800-FDA-1088. Where should I keep my medicine? This drug is given in a hospital or clinic and will not be stored at home. NOTE: This sheet is a summary. It may not cover all possible information. If you have questions about this medicine, talk to your doctor, pharmacist, or health care provider.    2016, Elsevier/Gold Standard. (2014-09-09 14:44:35)    AMBULATORY SURGERY  DISCHARGE INSTRUCTIONS   1) The drugs that you were given will stay in your system until tomorrow so for the next 24 hours you should not:  A) Drive an automobile B) Make any legal decisions C) Drink any alcoholic beverage   2) You may resume regular meals tomorrow.  Today it is better to start with liquids and gradually work up to solid foods.  You may eat anything  you prefer, but it is better to start with liquids, then soup and crackers, and gradually work up to solid foods.   3) Please notify your doctor immediately if you have any unusual bleeding,  trouble breathing, redness and pain at the surgery site, drainage, fever, or pain not relieved by medication.

## 2015-05-25 NOTE — Transfer of Care (Signed)
Immediate Anesthesia Transfer of Care Note  Patient: Katie Marks  Procedure(s) Performed: Procedure(s): LAPAROSCOPY DIAGNOSTIC (N/A) INTRAUTERINE DEVICE (IUD) REMOVAL, RIGHT OVARIAN CYSTOTOMY  (N/A)  Patient Location: PACU  Anesthesia Type:General  Level of Consciousness: sedated  Airway & Oxygen Therapy: Patient Spontanous Breathing and Patient connected to face mask oxygen  Post-op Assessment: Report given to RN and Post -op Vital signs reviewed and stable  Post vital signs: Reviewed and stable  Last Vitals:  Filed Vitals:   05/25/15 1306  BP:   Pulse:   Temp: 36.3 C  Resp:     Complications: No apparent anesthesia complications

## 2015-05-25 NOTE — Progress Notes (Signed)
Pt interviewed , no change in  H+P . NPO . Ready for surgery

## 2015-05-25 NOTE — Anesthesia Postprocedure Evaluation (Signed)
  Anesthesia Post-op Note  Patient: Katie Marks  Procedure(s) Performed: Procedure(s): LAPAROSCOPY DIAGNOSTIC (N/A) INTRAUTERINE DEVICE (IUD) REMOVAL, RIGHT OVARIAN CYSTOTOMY  (N/A)  Anesthesia type:General  Patient location: PACU  Post pain: Pain level controlled  Post assessment: Post-op Vital signs reviewed, Patient's Cardiovascular Status Stable, Respiratory Function Stable, Patent Airway and No signs of Nausea or vomiting  Post vital signs: Reviewed and stable  Last Vitals:  Filed Vitals:   05/25/15 1336  BP: 111/70  Pulse: 71  Temp:   Resp: 13    Level of consciousness: awake, alert  and patient cooperative  Complications: No apparent anesthesia complications

## 2015-05-26 LAB — HCV RNA QUANT: HCV Quantitative: NOT DETECTED IU/mL (ref >50)

## 2015-05-26 LAB — HIV ANTIBODY (ROUTINE TESTING W REFLEX): HIV SCREEN 4TH GENERATION: NONREACTIVE

## 2015-05-27 NOTE — Op Note (Signed)
NAMRoyann Marks:  Marks, Katie                ACCOUNT NO.:  [id number]  MEDICAL RECORD NO.:  [id number]  LOCATION:                               FACILITY:  [facility]  PHYSICIAN:  Jennell Cornerhomas Schermerhorn, MDDATE OF BIRTH:  04-17-1986  DATE OF PROCEDURE:  05/25/2015 DATE OF DISCHARGE:  05/25/2015                              OPERATIVE REPORT   PREOPERATIVE DIAGNOSIS: 1. Anterior abdominal intrauterine device. 2. Right ovarian cyst.  POSTOPERATIVE DIAGNOSIS: 1. Anterior abdominal intrauterine device. 2. Right ovarian cyst.  PROCEDURE: 1. Laparoscopic retrieval of intraabdominal intrauterine device. 2. Left ovarian cystotomy.  ANESTHESIA:  General endotracheal anesthesia.  SURGEON:  Jennell Cornerhomas Schermerhorn, MD  INDICATIONS:  This is a 29 year old, gravida 1, para 1 patient status post an IUD placement that resulted in intraabdominal migration, patient also known to have a left ovarian cyst.  DESCRIPTION OF PROCEDURE:  After adequate general endotracheal anesthesia, patient was placed in dorsal supine position.  Abdomen, perineum, and vagina were prepped and draped in normal sterile fashion. Time-out was performed.  Straight catheterization of the bladder yielded 400 mL clear urine.  A speculum was placed in the vagina and the anterior cervix was grasped with a single-tooth tenaculum and Acorn cannula was placed into the endocervix to be used for uterine manipulation during the procedure.  Gloves were changed and a 12 mm infraumbilical incision was made after injecting with 0.5% Marcaine. The laparoscope was advanced into the abdominal cavity with the Optiview cannula.  Once anterior abdominal placement was ascertained, patient's abdomen was insufflated with CO2.  A 2nd port site was placed in left lower quadrant, 3 cm medial to the left anterior iliac spine and under direct visualization, 5 mm port was advanced.  Initial evaluation showed a 4 x 3 cm left ovarian cyst.  Upon movement of the small  bowel, the string of the IUD was identified, it was grasped with a grasper and the IUD was removed without difficulty, it was not entangled or wrapped in any type of scar tissue.  Evaluation of the uterus failed to demonstrate any defects in the uterus.  Endo Shears were brought to the operative field and the left ovarian cyst was drained and typical straw-colored fluid resulted.  The fluid was removed with a suction catheter. Approximately 20 mL of fluid removed.  There was good hemostasis.  The patient's abdomen was deflated.  The infraumbilical incision was closed with a fascial layer with 2-0 Vicryl and both skin incisions were closed with interrupted 4-0 Vicryl suture.  LiquiBand was placed on the incisions and Tegaderm.  The single-tooth tenaculum was removed from the cervix.  Good hemostasis was noted.  There were no complications.  ESTIMATED BLOOD LOSS:  Minimal.  INTRAOPERATIVE FLUIDS:  1000 mL.  URINE OUTPUT:  400 mL.          ______________________________ Jennell Cornerhomas Schermerhorn, MD     TS/MEDQ  D:  05/25/2015  T:  05/26/2015  Job:  [id number]

## 2016-06-24 ENCOUNTER — Other Ambulatory Visit: Payer: Self-pay | Admitting: Obstetrics and Gynecology

## 2016-06-24 DIAGNOSIS — Z369 Encounter for antenatal screening, unspecified: Secondary | ICD-10-CM

## 2016-06-24 LAB — OB RESULTS CONSOLE VARICELLA ZOSTER ANTIBODY, IGG: VARICELLA IGG: IMMUNE

## 2016-06-24 LAB — OB RESULTS CONSOLE RUBELLA ANTIBODY, IGM: RUBELLA: IMMUNE

## 2016-06-24 LAB — OB RESULTS CONSOLE HIV ANTIBODY (ROUTINE TESTING): HIV: NONREACTIVE

## 2016-06-24 LAB — OB RESULTS CONSOLE GC/CHLAMYDIA
Chlamydia: NEGATIVE
GC PROBE AMP, GENITAL: NEGATIVE

## 2016-06-24 LAB — OB RESULTS CONSOLE HEPATITIS B SURFACE ANTIGEN: HEP B S AG: NEGATIVE

## 2016-07-11 ENCOUNTER — Ambulatory Visit (HOSPITAL_BASED_OUTPATIENT_CLINIC_OR_DEPARTMENT_OTHER)
Admission: RE | Admit: 2016-07-11 | Discharge: 2016-07-11 | Disposition: A | Discharge status: Home / Self Care | Payer: BLUE CROSS/BLUE SHIELD | Source: Ambulatory Visit | Attending: Obstetrics & Gynecology | Admitting: Obstetrics & Gynecology

## 2016-07-11 ENCOUNTER — Ambulatory Visit
Admission: RE | Admit: 2016-07-11 | Discharge: 2016-07-11 | Disposition: A | Discharge status: Home / Self Care | Payer: BLUE CROSS/BLUE SHIELD | Source: Ambulatory Visit | Attending: Obstetrics and Gynecology | Admitting: Obstetrics and Gynecology

## 2016-07-11 VITALS — BP 120/70 | HR 92 | Temp 98.1°F | Resp 17 | Ht 67.0 in | Wt 191.0 lb

## 2016-07-11 DIAGNOSIS — Z369 Encounter for antenatal screening, unspecified: Secondary | ICD-10-CM

## 2016-07-11 DIAGNOSIS — Z3A13 13 weeks gestation of pregnancy: Secondary | ICD-10-CM | POA: Insufficient documentation

## 2016-07-11 NOTE — Progress Notes (Addendum)
Ref[facility] physician[facility]  [facility] Length of Consultation: 40 minutes   [name]  [facility] to review prenatal screening and testing options.  This note summarizes the information we discussed.    We offered the following routine screening tests for this pregnancy:  First trimester screening, which includes nuchal translucency ultrasound screen and first trimester maternal serum marker screening.  The nuchal translucency has approximately an 80% detection rate for Down syndrome and can be positive for other chromosome abnormalities as well as congenital heart defects.  When combined with a maternal serum marker screening, the detection rate is up to 90% for Down syndrome and up to 97% for trisomy 18.     Maternal serum marker screening, a blood test that measures pregnancy proteins, can provide risk assessments for Down syndrome, trisomy 18, and open neural tube defects (spina bifida, anencephaly). Because it does not directly examine the fetus, it cannot positively diagnose or rule out these problems.  Targeted ultrasound uses high frequency sound waves to create an image of the developing fetus.  An ultrasound is often recommended as a routine means of evaluating the pregnancy.  It is also used to screen for fetal anatomy problems (for example, a heart defect) that might be suggestive of a chromosomal or other abnormality.   Should these screening tests indicate an increased concern, then the following additional testing options would be offered:  The chorionic villus sampling procedure is available for first trimester chromosome analysis.  This involves the withdrawal of a small amount of chorionic villi (tissue from the developing placenta).  Risk of pregnancy loss is estimated to be approximately 1 in 200 to 1 in 100 (0.5 to 1%).  There is approximately a 1% (1 in 100) chance that the CVS chromosome results will be  unclear.  Chorionic villi cannot be tested for neural tube defects.     Amniocentesis involves the removal of a small amount of amniotic fluid from the sac surrounding the fetus with the use of a thin needle inserted through the maternal abdomen and uterus.  Ultrasound guidance is used throughout the procedure.  Fetal cells from amniotic fluid are directly evaluated and > 99.5% of chromosome problems and > 98% of open neural tube defects can be detected. This procedure is generally performed after the 15th week of pregnancy.  The main risks to this procedure include complications leading to miscarriage in less than 1 in 200 cases (0.5%).  As another option for information if the pregnancy is suspected to be an an increased chance for certain chromosome conditions, we also reviewed the availability of cell free fetal DNA testing from maternal blood to determine whether or not the baby may have either Down syndrome, trisomy 3713, or trisomy 4518.  This test utilizes a maternal blood sample and DNA sequencing technology to isolate circulating cell free fetal DNA from maternal plasma.  The fetal DNA can then be analyzed for DNA sequences that are derived from the three most common chromosomes involved in aneuploidy, chromosomes 13, 18, and 21.  If the overall amount of DNA is greater than the expected level for any of these chromosomes, aneuploidy is suspected.  While we do not consider it a replacement for invasive testing and karyotype analysis, a negative result from this testing would be reassuring, though not a guarantee of a normal chromosome complement for the baby.  An abnormal result is certainly suggestive of an abnormal chromosome complement, though  we would still recommend CVS or amniocentesis to confirm any findings from this testing.  Cystic Fibrosis and Spinal Muscular Atrophy (SMA) screening were also discussed with the patient. Both conditions are recessive, which means that both parents must be  carriers in order to have a child with the disease.  Cystic fibrosis (CF) is one of the most common genetic conditions in persons of Caucasian ancestry.  This condition occurs in approximately 1 in 2,500 Caucasian persons and results in thickened secretions in the lungs, digestive, and reproductive systems.  For a baby to be at risk for having CF, both of the parents must be carriers for this condition.  Approximately 1 in 6225 Caucasian persons is a carrier for CF.  Current carrier testing looks for the most common mutations in the gene for CF and can detect approximately 90% of carriers in the Caucasian population.  This means that the carrier screening can greatly reduce, but cannot eliminate, the chance for an individual to have a child with CF.  If an individual is found to be a carrier for CF, then carrier testing would be available for the partner. As part of [place] newborn screening profile, all babies born in the state of [place] will have a two-tier screening process.  Specimens are first tested to determine the concentration of immunoreactive trypsinogen (IRT).  The top 5% of specimens with the highest IRT values then undergo DNA testing using a panel of over 40 common CF mutations. SMA is a neurodegenerative disorder that leads to atrophy of skeletal muscle and overall weakness.  This condition is also more prevalent in the Caucasian population, with 1 in 40-1 in 60 persons being a carrier and 1 in 6,000-1 in 10,000 children being affected.  There are multiple forms of the disease, with some causing death in infancy to other forms with survival into adulthood.  The genetics of SMA is complex, but carrier screening can detect up to 95% of carriers in the Caucasian population.  Similar to CF, a negative result can greatly reduce, but cannot eliminate, the chance to have a child with SMA.  We reviewed the detailed family history obtained during her visit in 2015.  They reported no changes  in the family history and had no additional information about the aunt with developmental differences.  The couple has a 5817 month old son, Katie Marks, who is in good health.  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  Katie Marks requested carrier testing for herself, which was ordered by her OB as a hemoglobin solubility.  This result was negative.  Hemoglobin solubility testing is accurate as a screen for sickle cell trait, but does not detect other hemoglobin variants such as C or E trait.  Because Katie Marks has S trait, and Katie Marks appears to be negative, we would assume that her husband has the S trait.  In order to confirm that this pregnancy is not at risk for a clinically significant hemoglobinopathy, we would recommend hemoglobinopathy testing for [facility].  We were not able to get enough blood today for this testing, so she would like it to be performed at her next OB visit.  We would suggest [facility] test number [id number], Hemoglobinopathy Profile.  Also of note, her MCV is normal, reducing the chance that she may be a carrier for thalassemia.  The remainder of the family history was reported to be unremarkable for birth defects, mental retardation, recurrent pregnancy loss or known chromosome abnormalities.  [name]  stated that this is her second pregnancy.   She reported no complications or exposures that would be expected to increase the risk for birth defects.  After consideration of the options, [name] elected to proceed with first trimester screening, which was performed today.  Results should be available within one week.  We were unable to draw blood for hemoglobinopathy testing, which she would like performed at her next OB visit.  An ultrasound was performed at the time of the visit.  The gestational age was consistent with 13 weeks.  Fetal anatomy could not be assessed due to early gestational age.  Please refer to the ultrasound report for details of that study.  Katie Marks  was encouraged to call with questions or concerns.  We can be contacted at [phone].    Cherly Anderson, MS, CGC  Kristiana Jacko, [place] A, MD

## 2016-07-14 ENCOUNTER — Telehealth: Payer: Self-pay | Admitting: Obstetrics and Gynecology

## 2016-07-14 NOTE — Telephone Encounter (Signed)
[name]  elected to undergo First Trimester screening on 07/11/2016.  To review, first trimester screening, includes nuchal translucency ultrasound screen and/or first trimester maternal serum marker screening.  The nuchal translucency has approximately an 80% detection rate for Down syndrome and can be positive for other chromosome abnormalities as well as heart defects.  When combined with a maternal serum marker screening, the detection rate is up to 90% for Down syndrome and up to 97% for trisomy 13 and 18.     The results of the First Trimester Nuchal Translucency and Biochemical Screening were within normal range.  The risk for Down syndrome is now estimated to be 1 in 1,810.  The risk for Trisomy 13/18 is less than 1 in 10,000.  Should more definitive information be desired, we would offer amniocentesis.  Because we do not yet know the effectiveness of combined first and second trimester screening, we do not recommend a maternal serum screen to assess the chance for chromosome conditions.  However, if screening for neural tube defects is desired, maternal serum screening for AFP only can be performed between 15 and [redacted] weeks gestation.    If hemoglobin electrophoresis testing is desired, this can be ordered at her next OB visit (see genetic counseling note from 07/11/2016).  We may be reached at [phone] with questions.  Cherly Andersoneborah F. Mrk Buzby, MS, CGC

## 2016-07-29 ENCOUNTER — Encounter (HOSPITAL_COMMUNITY): Payer: Self-pay

## 2016-12-21 LAB — OB RESULTS CONSOLE GC/CHLAMYDIA
Chlamydia: NEGATIVE
Gonorrhea: NEGATIVE

## 2016-12-21 LAB — OB RESULTS CONSOLE GBS: GBS: NEGATIVE

## 2016-12-21 LAB — OB RESULTS CONSOLE RPR: RPR: NONREACTIVE

## 2017-01-15 ENCOUNTER — Inpatient Hospital Stay
Admission: EM | Admit: 2017-01-15 | Discharge: 2017-01-17 | DRG: 775 | Disposition: A | Discharge status: Home / Self Care | Payer: BLUE CROSS/BLUE SHIELD | Attending: Obstetrics and Gynecology | Admitting: Obstetrics and Gynecology

## 2017-01-15 DIAGNOSIS — Z3A39 39 weeks gestation of pregnancy: Secondary | ICD-10-CM | POA: Diagnosis not present

## 2017-01-15 DIAGNOSIS — E669 Obesity, unspecified: Secondary | ICD-10-CM | POA: Diagnosis present

## 2017-01-15 DIAGNOSIS — O99214 Obesity complicating childbirth: Principal | ICD-10-CM | POA: Diagnosis present

## 2017-01-15 DIAGNOSIS — Z3493 Encounter for supervision of normal pregnancy, unspecified, third trimester: Secondary | ICD-10-CM | POA: Diagnosis present

## 2017-01-15 LAB — TYPE AND SCREEN
ABO/RH(D): A POS
ANTIBODY SCREEN: NEGATIVE

## 2017-01-15 LAB — CBC
HEMATOCRIT: 31.1 % — AB (ref 35.0–47.0)
HEMOGLOBIN: 10.6 g/dL — AB (ref 12.0–16.0)
MCH: 26.8 pg (ref 26.0–34.0)
MCHC: 34 g/dL (ref 32.0–36.0)
MCV: 79 fL — AB (ref 80.0–100.0)
Platelets: 215 10*3/uL (ref 150–440)
RBC: 3.93 MIL/uL (ref 3.80–5.20)
RDW: 15.3 % — AB (ref 11.5–14.5)
WBC: 8.5 10*3/uL (ref 3.6–11.0)

## 2017-01-15 LAB — RAPID HIV SCREEN (HIV 1/2 AB+AG)
HIV 1/2 Antibodies: NONREACTIVE
HIV-1 P24 Antigen - HIV24: NONREACTIVE

## 2017-01-15 MED ORDER — DIPHENHYDRAMINE HCL 25 MG PO CAPS: 25.0000 mg | ORAL_CAPSULE | Freq: Four times a day (QID) | ORAL | Status: DC | PRN

## 2017-01-15 MED ORDER — ONDANSETRON HCL 4 MG PO TABS: 4.0000 mg | ORAL_TABLET | ORAL | Status: DC | PRN

## 2017-01-15 MED ORDER — SENNOSIDES-DOCUSATE SODIUM 8.6-50 MG PO TABS
2.0000 | ORAL_TABLET | ORAL | Status: DC
  Administered 2017-01-17: 2 via ORAL
  Filled 2017-01-15: qty 2

## 2017-01-15 MED ORDER — LIDOCAINE HCL (PF) 1 % IJ SOLN
INTRAMUSCULAR | Status: AC
  Filled 2017-01-15: qty 30

## 2017-01-15 MED ORDER — MISOPROSTOL 200 MCG PO TABS
ORAL_TABLET | ORAL | Status: AC
  Filled 2017-01-15: qty 4

## 2017-01-15 MED ORDER — ONDANSETRON HCL 4 MG/2ML IJ SOLN: 4.0000 mg | Freq: Four times a day (QID) | INTRAMUSCULAR | Status: DC | PRN

## 2017-01-15 MED ORDER — ONDANSETRON HCL 4 MG/2ML IJ SOLN: 4.0000 mg | INTRAMUSCULAR | Status: DC | PRN

## 2017-01-15 MED ORDER — TETANUS-DIPHTH-ACELL PERTUSSIS 5-2.5-18.5 LF-MCG/0.5 IM SUSP: 0.5000 mL | Freq: Once | INTRAMUSCULAR | Status: DC

## 2017-01-15 MED ORDER — SODIUM CHLORIDE 0.9% FLUSH: 3.0000 mL | Freq: Two times a day (BID) | INTRAVENOUS | Status: DC

## 2017-01-15 MED ORDER — LIDOCAINE HCL (PF) 1 % IJ SOLN
30.0000 mL | INTRAMUSCULAR | Status: DC | PRN
  Administered 2017-01-15: 30 mL via SUBCUTANEOUS

## 2017-01-15 MED ORDER — LACTATED RINGERS IV SOLN
INTRAVENOUS | Status: DC
  Administered 2017-01-15: 15:00:00 via INTRAVENOUS

## 2017-01-15 MED ORDER — ZOLPIDEM TARTRATE 5 MG PO TABS: 5.0000 mg | ORAL_TABLET | Freq: Every evening | ORAL | Status: DC | PRN

## 2017-01-15 MED ORDER — LACTATED RINGERS IV SOLN: 500.0000 mL | INTRAVENOUS | Status: DC | PRN

## 2017-01-15 MED ORDER — DIBUCAINE 1 % RE OINT: 1.0000 "application " | TOPICAL_OINTMENT | RECTAL | Status: DC | PRN

## 2017-01-15 MED ORDER — SODIUM CHLORIDE 0.9 % IV SOLN: 250.0000 mL | INTRAVENOUS | Status: DC | PRN

## 2017-01-15 MED ORDER — PRENATAL MULTIVITAMIN CH
1.0000 | ORAL_TABLET | Freq: Every day | ORAL | Status: DC
  Administered 2017-01-16 – 2017-01-17 (×2): 1 via ORAL
  Filled 2017-01-15 (×2): qty 1

## 2017-01-15 MED ORDER — SIMETHICONE 80 MG PO CHEW: 80.0000 mg | CHEWABLE_TABLET | ORAL | Status: DC | PRN

## 2017-01-15 MED ORDER — WITCH HAZEL-GLYCERIN EX PADS: 1.0000 "application " | MEDICATED_PAD | CUTANEOUS | Status: DC | PRN

## 2017-01-15 MED ORDER — ACETAMINOPHEN 325 MG PO TABS: 650.0000 mg | ORAL_TABLET | ORAL | Status: DC | PRN

## 2017-01-15 MED ORDER — AMMONIA AROMATIC IN INHA
RESPIRATORY_TRACT | Status: AC
  Filled 2017-01-15: qty 10

## 2017-01-15 MED ORDER — FLEET ENEMA 7-19 GM/118ML RE ENEM: 1.0000 | ENEMA | Freq: Every day | RECTAL | Status: DC | PRN

## 2017-01-15 MED ORDER — SODIUM CHLORIDE 0.9% FLUSH: 3.0000 mL | INTRAVENOUS | Status: DC | PRN

## 2017-01-15 MED ORDER — BENZOCAINE-MENTHOL 20-0.5 % EX AERO
1.0000 "application " | INHALATION_SPRAY | CUTANEOUS | Status: DC | PRN
  Administered 2017-01-15: 1 via TOPICAL
  Filled 2017-01-15: qty 56

## 2017-01-15 MED ORDER — OXYTOCIN 40 UNITS IN LACTATED RINGERS INFUSION - SIMPLE MED
2.5000 [IU]/h | INTRAVENOUS | Status: DC
  Administered 2017-01-15: 2.5 [IU]/h via INTRAVENOUS
  Filled 2017-01-15: qty 1000

## 2017-01-15 MED ORDER — BUTORPHANOL TARTRATE 2 MG/ML IJ SOLN
1.0000 mg | INTRAMUSCULAR | Status: DC | PRN
  Filled 2017-01-15: qty 2

## 2017-01-15 MED ORDER — BENZOCAINE-MENTHOL 20-0.5 % EX AERO
INHALATION_SPRAY | CUTANEOUS | Status: AC
  Filled 2017-01-15: qty 56

## 2017-01-15 MED ORDER — OXYTOCIN 10 UNIT/ML IJ SOLN
INTRAMUSCULAR | Status: AC
  Filled 2017-01-15: qty 2

## 2017-01-15 MED ORDER — IBUPROFEN 600 MG PO TABS
600.0000 mg | ORAL_TABLET | Freq: Four times a day (QID) | ORAL | Status: DC
  Administered 2017-01-15 – 2017-01-17 (×8): 600 mg via ORAL
  Filled 2017-01-15 (×8): qty 1

## 2017-01-15 MED ORDER — OXYTOCIN BOLUS FROM INFUSION
500.0000 mL | Freq: Once | INTRAVENOUS | Status: AC
  Administered 2017-01-15: 500 mL via INTRAVENOUS

## 2017-01-15 MED ORDER — SOD CITRATE-CITRIC ACID 500-334 MG/5ML PO SOLN
30.0000 mL | ORAL | Status: DC | PRN
  Filled 2017-01-15: qty 30

## 2017-01-15 MED ORDER — COCONUT OIL OIL: 1.0000 "application " | TOPICAL_OIL | Status: DC | PRN

## 2017-01-15 MED ORDER — BISACODYL 10 MG RE SUPP: 10.0000 mg | Freq: Every day | RECTAL | Status: DC | PRN

## 2017-01-15 NOTE — Progress Notes (Addendum)
VAGINAL DELIVERY NOTE:  Date of Delivery: 01/15/2017 [facility] Gestational Age/EDD: 789w4d 01/18/2017 Antepartum complications: Obesity Attending Physician: CJones, CNM Delivery Type: spontaneous vaginal delivery  Anesthesia: local Laceration: 2nd degree Lt Laginal/perineal requiring 6 running sutures.  Episiotomy: none Placenta: spontaneous Intrapartum complications: None Estimated Blood Loss: 100 ml's GBS: Neg Procedure Details: With pt and she began to feel pressure, cx checked and vtx at +2 station and complete at 1719. Enc to push and in the lithotomy position with 3 good pushes, NSVD of viable female in straight OA position, no CAN, Vtx, Ant and post shoulder and body del at 1721 and to mom'a abd. Baby crying with good color. No mec seen. Delayed cord clamping with CCx2 and cut per dad. 3VC noted. SDOP intact at 1727 with trailing mucus and no membranes. 2nd degree lac noted in the perineum and 1% xylocaine used as a local. 3-0 CH on CT for repair. Pt also used Nitrous oxide for pain relief during the repair. FF and lochia mod. Hemostasis achieved. VSS. No cord blood needed. Sponge count correct 10 sponges and 1 lap and 3 needles and correct. Bonding skin to skin with baby. Baby: Liveborn:Girl , Apgars (8/9), weight 3720gms,8 #, 3oz, baby named "Melina"

## 2017-01-15 NOTE — Discharge Instructions (Signed)
Postpartum Care After Vaginal Delivery °The period of time right after you deliver your newborn is called the postpartum period. °What kind of medical care will I receive? °· You may continue to receive fluids and medicines through an IV tube inserted into one of your veins. °· If an incision was made near your vagina (episiotomy) or if you had some vaginal tearing during delivery, cold compresses may be placed on your episiotomy or your tear. This helps to reduce pain and swelling. °· You may be given a squirt bottle to use when you go to the bathroom. You may use this until you are comfortable wiping as usual. To use the squirt bottle, follow these steps: °? Before you urinate, fill the squirt bottle with warm water. Do not use hot water. °? After you urinate, while you are sitting on the toilet, use the squirt bottle to rinse the area around your urethra and vaginal opening. This rinses away any urine and blood. °? You may do this instead of wiping. As you start healing, you may use the squirt bottle before wiping yourself. Make sure to wipe gently. °? Fill the squirt bottle with clean water every time you use the bathroom. °· You will be given sanitary pads to wear. °How can I expect to feel? °· You may not feel the need to urinate for several hours after delivery. °· You will have some soreness and pain in your abdomen and vagina. °· If you are breastfeeding, you may have uterine contractions every time you breastfeed for up to several weeks postpartum. Uterine contractions help your uterus return to its normal size. °· It is normal to have vaginal bleeding (lochia) after delivery. The amount and appearance of lochia is often similar to a menstrual period in the first week after delivery. It will gradually decrease over the next few weeks to a dry, yellow-brown discharge. For most women, lochia stops completely by 6-8 weeks after delivery. Vaginal bleeding can vary from woman to woman. °· Within the first few  days after delivery, you may have breast engorgement. This is when your breasts feel heavy, full, and uncomfortable. Your breasts may also throb and feel hard, tightly stretched, warm, and tender. After this occurs, you may have milk leaking from your breasts. Your health care provider can help you relieve discomfort due to breast engorgement. Breast engorgement should go away within a few days. °· You may feel more sad or worried than normal due to hormonal changes after delivery. These feelings should not last more than a few days. If these feelings do not go away after several days, speak with your health care provider. °How should I care for myself? °· Tell your health care provider if you have pain or discomfort. °· Drink enough water to keep your urine clear or pale yellow. °· Wash your hands thoroughly with soap and water for at least 20 seconds after changing your sanitary pads, after using the toilet, and before holding or feeding your baby. °· If you are not breastfeeding, avoid touching your breasts a lot. Doing this can make your breasts produce more milk. °· If you become weak or lightheaded, or you feel like you might faint, ask for help before: °? Getting out of bed. °? Showering. °· Change your sanitary pads frequently. Watch for any changes in your flow, such as a sudden increase in volume, a change in color, the passing of large blood clots. If you pass a blood clot from your vagina, save it   to show to your health care provider. Do not flush blood clots down the toilet without having your health care provider look at them. °· Make sure that all your vaccinations are up to date. This can help protect you and your baby from getting certain diseases. You may need to have immunizations done before you leave the hospital. °· If desired, talk with your health care provider about methods of family planning or birth control (contraception). °How can I start bonding with my baby? °Spending as much time as  possible with your baby is very important. During this time, you and your baby can get to know each other and develop a bond. Having your baby stay with you in your room (rooming in) can give you time to get to know your baby. Rooming in can also help you become comfortable caring for your baby. Breastfeeding can also help you bond with your baby. °How can I plan for returning home with my baby? °· Make sure that you have a car seat installed in your vehicle. °? Your car seat should be checked by a certified car seat installer to make sure that it is installed safely. °? Make sure that your baby fits into the car seat safely. °· Ask your health care provider any questions you have about caring for yourself or your baby. Make sure that you are able to contact your health care provider with any questions after leaving the hospital. °This information is not intended to replace advice given to you by your health care provider. Make sure you discuss any questions you have with your health care provider. °Document Released: 05/29/2007 Document Revised: 01/04/2016 Document Reviewed: 07/06/2015 °Elsevier Interactive Patient Education © 2018 [employer] ° °

## 2017-01-15 NOTE — Discharge Summary (Signed)
Obstetric Discharge Summary   Patient ID: Katie Marks MRN: [id number] DOB/AGE: [birth year] 30 y.o.   Date of Admission: 01/15/2017  Date of Discharge: 01/17/17  Admitting Diagnosis: Term pregnancy at 6928w4d  Secondary Diagnosis: Obesity  Mode of Delivery:NSVD of viable female infant with 2nd degree lac     Discharge Diagnosis: NSVD of viable female    Intrapartum Procedures: AROM, Ext fetal and uterine monitors, Nitrous oxide   Post partum procedures: None  Complications:None   Brief Hospital Course  Katie MarusMina Waitman is a G2P2001 who had a SVD on 01/15/17;  for further details of this delivery, please refer to the delivery note.  Patient had an uncomplicated postpartum course.  By time of discharge on PPD#2, her pain was controlled on oral pain medications; she had appropriate lochia and was ambulating, voiding without difficulty and tolerating regular diet.  She was deemed stable for discharge to home.     Labs: CBC Latest Ref Rng & Units 01/15/2017 05/21/2015 01/29/2015  WBC 3.6 - 11.0 K/uL 8.5 6.7 14.0(H)  Hemoglobin 12.0 - 16.0 g/dL 10.6(L) 11.8(L) 10.1(L)  Hematocrit 35.0 - 47.0 % 31.1(L) 36.0 30.2(L)  Platelets 150 - 440 K/uL 215 262 175   A POS Varicella and Rubella immune Tdap: not given AP due to pt being sick at that visit  Physical exam:  Blood pressure 133/75, pulse 83, temperature 97.9 F (36.6 C), temperature source Axillary, resp. rate 16, height 5\' 7"  (1.702 m), weight 245 lb (111.1 kg), last menstrual period 04/16/2016, SpO2 99 %, unknown if currently breastfeeding. General: alert and no distress CV:S1S2, RRR, No M/R/G Lungs:CTA bilat, no W/R/R. Lochia: appropriate Abdomen: soft, NT Uterine Fundus: firm Perineum:healing, intact. Extremities: No evidence of DVT seen on physical exam. No lower extremity edema.  Discharge Instructions: Per After Visit Summary. Activity: Advance as tolerated. Pelvic rest for 6 weeks.  Also refer to After Visit Summary Diet:  Regular Medications:  Outpatient follow up:  Postpartum contraception: Partner plans vasectomy  Discharged Condition: Stable   Discharged to Home Pt:Wants to try pumping but, otherwise bottle   Newborn Data:  Baby "Girl"named "Melina"  Disposition: Home with pt  Apgars: APGAR (1 MIN): 8   APGAR (5 MINS): 9   APGAR (10 MINS):    Baby Feeding: Bottle  Sharee Pimplearon W Jones, CNM 01/15/2017

## 2017-01-15 NOTE — H&P (Signed)
HISTORY AND PHYSICAL  HISTORY OF PRESENT ILLNESS: [name] is a 31 y.o. G2P1011 at 9784w4d by exact LMP of 04/16/16 with EDD of 01/18/17 consistent with 13 1/7 week ultrasound with EDD of 01/21/17 but, pt has 26 day periods. Pt started contracting early this am and now is hurting more intensely in her lower back and lower abd having intense contractions.    She has  been having contractions Q and denies leakage of fluid, vaginal bleeding, or decreased fetal movement.    REVIEW OF SYSTEMS: A complete review of systems was performed and was specifically negative for headache, changes in vision, RUQ pain, shortness of breath, chest pain, lower extremity edema and dysuria.   HISTORY:  Past Medical History:  Diagnosis Date  . ADHD (attention deficit hyperactivity disorder)   . History of panic attacks   . Petit mal (HCC)   . Seizures (HCC) May 20, 2015   petit mal--no medication at present  No seizures x 20 years  Past Surgical History:  Procedure Laterality Date  . IUD REMOVAL N/A 05/25/2015   Procedure: INTRAUTERINE DEVICE (IUD) REMOVAL, RIGHT OVARIAN CYSTOTOMY ;  Surgeon: Suzy Bouchardhomas J Schermerhorn, MD;  Location: [facility];  Service: Gynecology;  Laterality: N/A;  . LAPAROSCOPY N/A 05/25/2015   Procedure: LAPAROSCOPY DIAGNOSTIC;  Surgeon: Suzy Bouchardhomas J Schermerhorn, MD;  Location: [facility];  Service: Gynecology;  Laterality: N/A;  . NO PAST SURGERIES      No current facility-administered medications on file prior to encounter.    Current Outpatient Prescriptions on File Prior to Encounter  Medication Sig Dispense Refill  . Prenatal Vit-Fe Fumarate-FA (MULTIVITAMIN-PRENATAL) 27-0.8 MG TABS tablet Take 1 tablet by mouth daily at 12 noon.       No Known Allergies  OB History  Gravida Para Term Preterm AB Living  2 1 1     1   SAB TAB Ectopic Multiple Live Births        0 1    # Outcome Date GA Lbr Len/2nd Weight Sex Delivery Anes PTL Lv  2 Current           1 Term 01/29/15 6474w3d   M  Vag-Spont   LIV     FMH:DVT in Mom Gynecologic History: History of Abnormal Pap Smear: 2015, no abnormal paps History of ZOX:WRUESTI:none  Social History  Substance Use Topics  . Smoking status: Never Smoker  . Smokeless tobacco: Never Used  . Alcohol use No    PHYSICAL EXAM: Temp:  [97.9 F (36.6 C)] 97.9 F (36.6 C) (06/03 1428) Pulse Rate:  [83] 83 (06/03 1428) Resp:  [16] 16 (06/03 1428) BP: (127)/(88) 127/88 (06/03 1428) Weight:  [245 lb (111.1 kg)] 245 lb (111.1 kg) (06/03 1428)  GENERAL: NAD AAOx3 CHEST:CTAB no increased work of breathing, No W/R/R. CV:RRR no appreciable murmurs, rubs, gallops S1S2. ABDOMEN: gravid, nontender, EFW 8#9ozby [place] EXTREMITIES:  Warm and well-perfused, nontender, nonedematous, 1+ DTR 0 cllonus CERVIX: 5/90/vtx-2  FHT's:  baseline with variability  accelerations and  decelerations  Toco: q 2-3 mins  DIAGNOSTIC STUDIES: No results for input(s): WBC, HGB, HCT, PLT, NA, K, CL, CO2, BUN, CREATININE, LABGLOM, GLUCOSE, CALCIUM, BILIDIR, ALKPHOS, AST, ALT, PROT, MG in the last 168 hours.  Invalid input(s): LABALB, UA  PRENATAL STUDIES:  Prenatal Labs:  MBT: A pos, Antibody neg,  Rubella immune, Varicella immune, HIV neg, RPR neg, Hep B neg, GC/CT neg, GBS neg , glucola 103  Last US 23 wks g (46 %ile) placenta  above the  os, AF wnl, normal anatomy  ASSESSMENT AND PLAN:  1. Fetal Well being  - Fetal Tracing: reassuring  - Ultrasound: reviewed, as above - Group B Streptococcus: neg - Presentation: vtx confirmed by RN today   2. Routine OB: - Prenatal labs reviewed, as above - Rh A pos  3. Active Labor process -  Contractions:external toco in place -  Pelvis proven to 8#6oz -  Plan for AROM and poss Nitrous Oxide for pain control  4. Post Partum Planning: - Infant feeding:  - Contraception: partner plans vasectomy

## 2017-01-15 NOTE — OB Triage Note (Signed)
Presents for ctx coming every 3-10 minutes since 0530 this morning.  Pt reports + fetal mov't.  Denies LOF or bleeding.

## 2017-01-16 LAB — CBC
HCT: 25.9 % — ABNORMAL LOW (ref 35.0–47.0)
Hemoglobin: 8.8 g/dL — ABNORMAL LOW (ref 12.0–16.0)
MCH: 27.2 pg (ref 26.0–34.0)
MCHC: 33.9 g/dL (ref 32.0–36.0)
MCV: 80.2 fL (ref 80.0–100.0)
PLATELETS: 181 10*3/uL (ref 150–440)
RBC: 3.22 MIL/uL — AB (ref 3.80–5.20)
RDW: 15.3 % — ABNORMAL HIGH (ref 11.5–14.5)
WBC: 10 10*3/uL (ref 3.6–11.0)

## 2017-01-16 NOTE — Progress Notes (Signed)
Post Partum Day 1 Subjective: no complaints  Objective: Blood pressure 115/70, pulse 74, temperature 98 F (36.7 C), temperature source Oral, resp. rate 20, height 5\' 7"  (1.702 m), weight 245 lb (111.1 kg), last menstrual period 04/16/2016, SpO2 97 %, unknown if currently breastfeeding.  Physical Exam:  General: alert and cooperative Lochia: appropriate Uterine Fundus: firm Incision: n/a DVT Evaluation: No evidence of DVT seen on physical exam. Lungs CTA  CV RRR   Recent Labs  01/15/17 1513 01/16/17 0538  HGB 10.6* 8.8*  HCT 31.1* 25.9*    Assessment/Plan: Plan for discharge tomorrow   LOS: 1 day   Katie Marks 01/16/2017, 1:35 PM

## 2017-01-17 LAB — RPR: RPR: NONREACTIVE

## 2017-01-17 NOTE — Progress Notes (Signed)
Period of Purple cry video viewed by pt and significant other.  Copy given to take home. All questions answered.

## 2017-01-18 ENCOUNTER — Inpatient Hospital Stay: Admission: RE | Admit: 2017-01-18 | Payer: BLUE CROSS/BLUE SHIELD | Source: Ambulatory Visit | Admitting: *Deleted

## 2017-05-05 IMAGING — US US MFM OB COMPLETE +14 WKS
1 series · 13 of 28 positions shown · non-contrast
Comparison: none

PATIENT INFO:

PERFORMED BY:
SERVICE(S) PROVIDED:
US MFM OB COMP LESS THAN 14 WEEKS                     76801.4
INDICATIONS:
First trimester screen
FETAL EVALUATION:
Num Of Fetuses:     1
Preg. Location:     Mid
Fetal Heart         147
Rate(bpm):
Cardiac Activity:   Present
Presentation:       Variable
Placenta:           Anterior Grade 0, No previa
Amniotic Fluid
AFI FV:      Normal
BIOMETRY:
CRL:      69.1  mm     G. Age:  13w 1d                  EDD:    01/15/17
GESTATIONAL AGE:
LMP:           12w 2d        Date:  04/16/16                 EDD:   01/21/17
1ST TRIMESTER GENETIC SONOGRAM SCREENING:
CRL:            69.1  mm    G. Age:   13w 1d                 EDD:   01/15/17
Nuc Trans:       2.5  mm
ANATOMY:
Cranium:               Normal appearance      Bladder:                Seen
Cavum:                 Normal appearance      Upper Extremities:      Visualized
Thoracic:              Normal appearance      Lower Extremities:      Visualized
Stomach:               Seen
CERVIX UTERUS ADNEXA:
Cervix
Length:           3.83  cm.

[Series 1: us mfm ob complete +14 wks · 0.09mm/px · 13 of 42 slices shown]
[im 2/42]
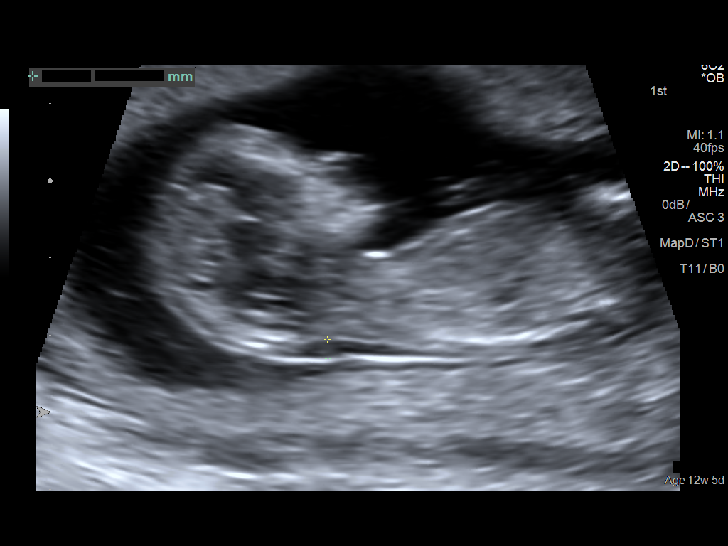
[im 5/42]
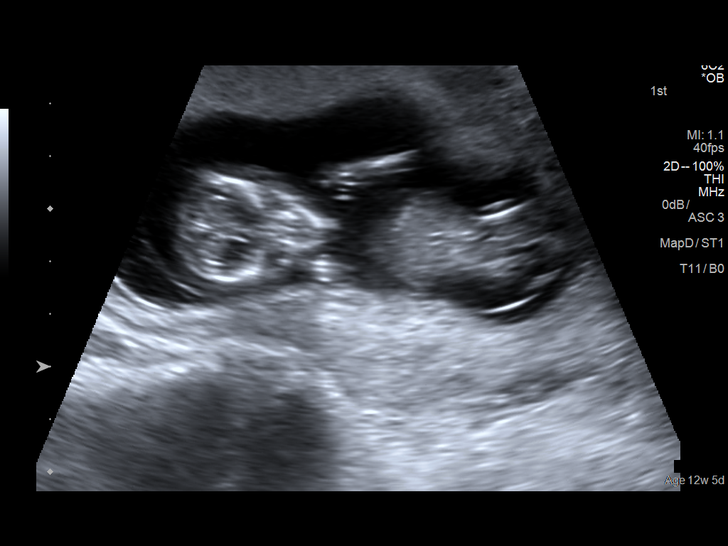
[im 8/42]
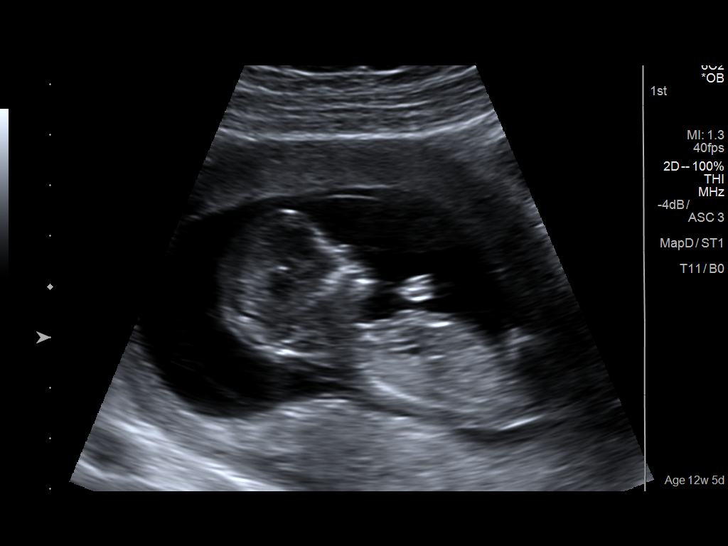
[im 11/42]
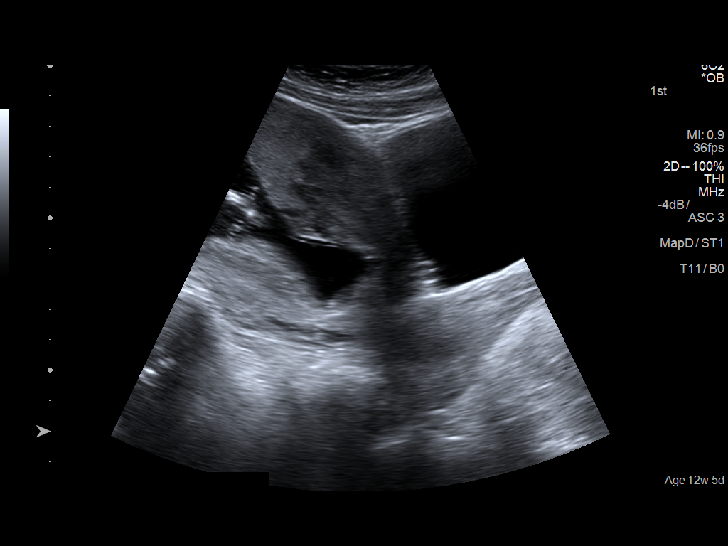
[im 14/42]
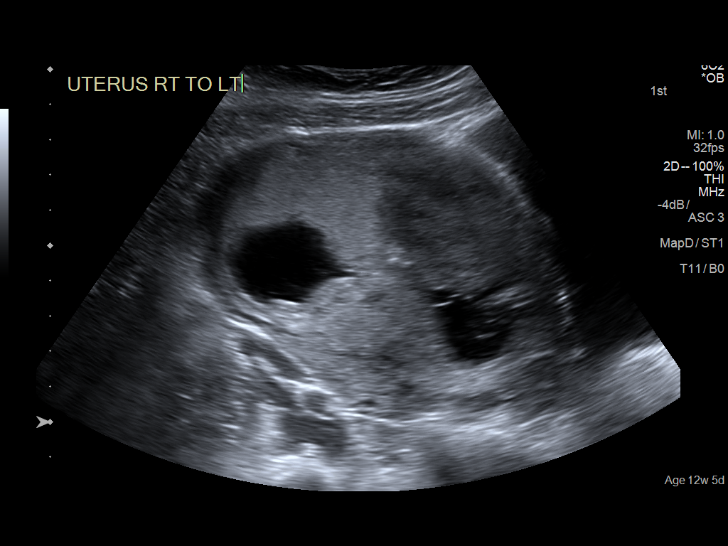
[im 17/42]
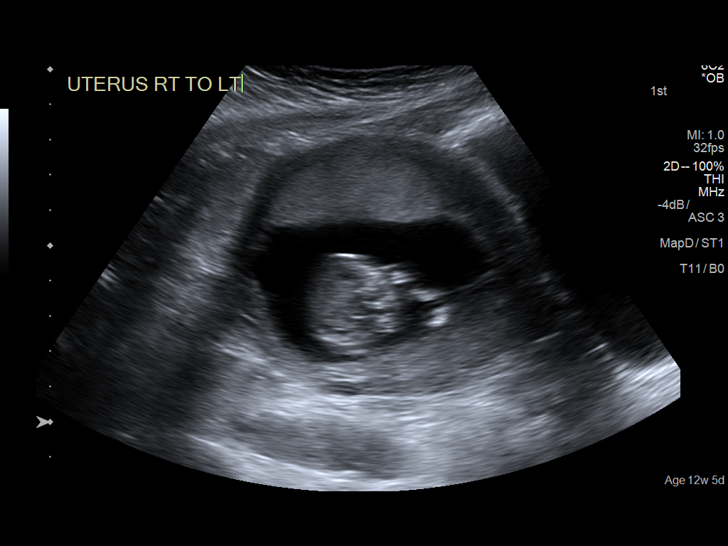
[im 22/42]
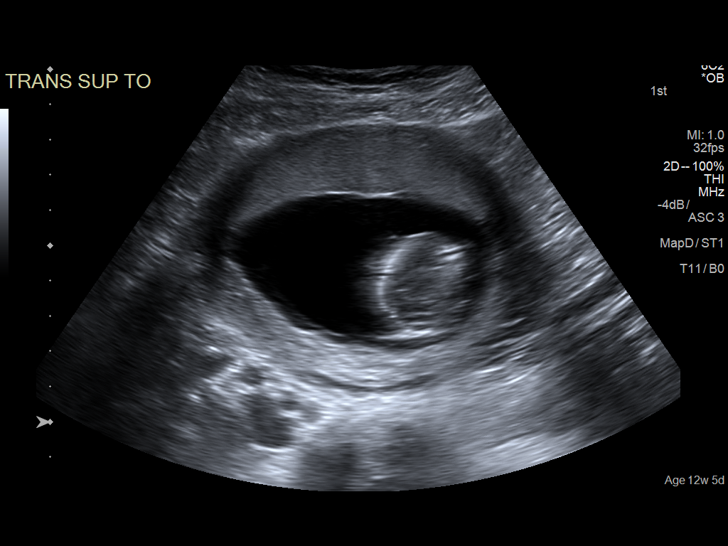
[im 25/42]
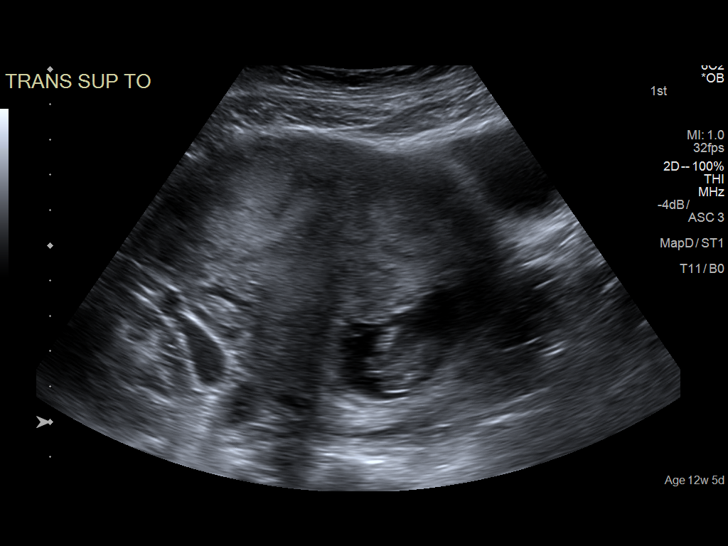
[im 28/42]
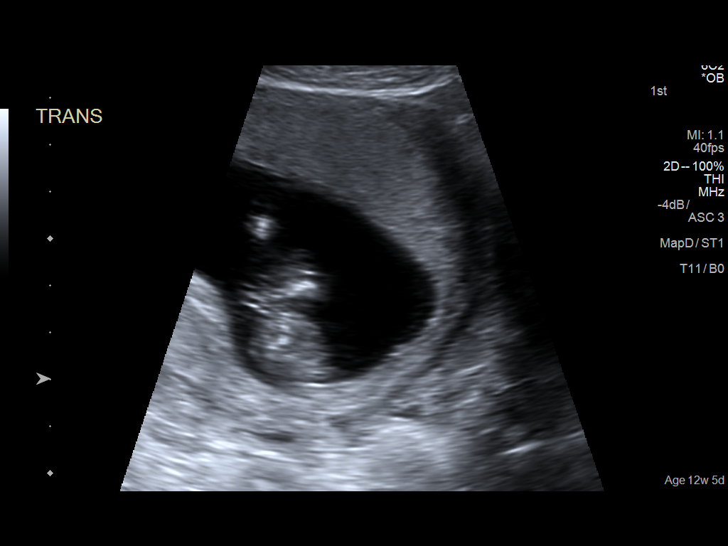
[im 31/42]
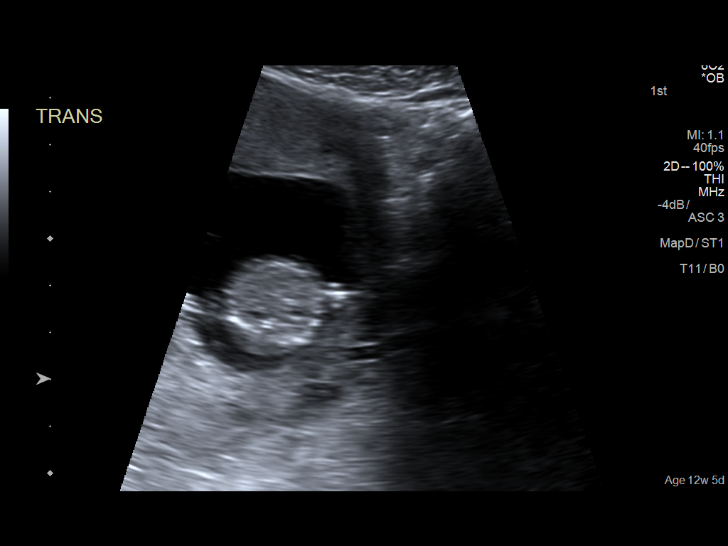
[im 34/42]
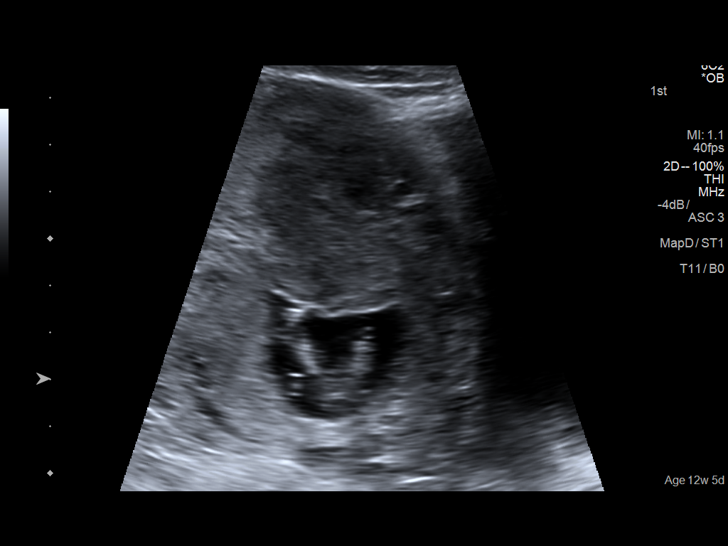
[im 37/42]
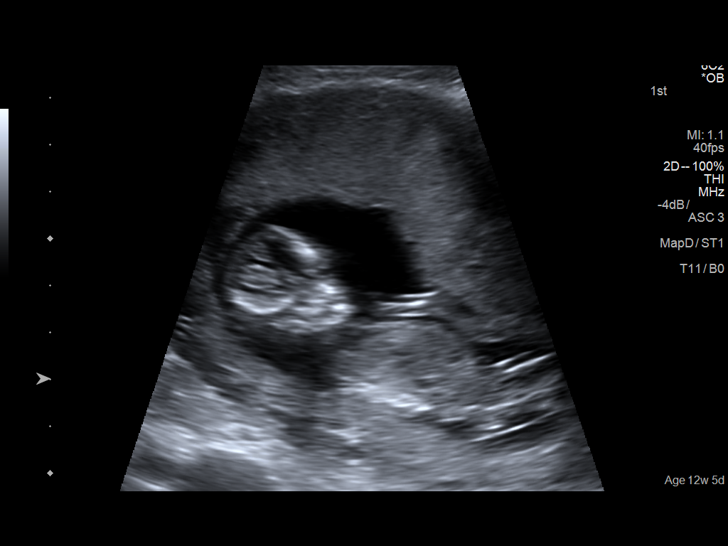
[im 40/42]
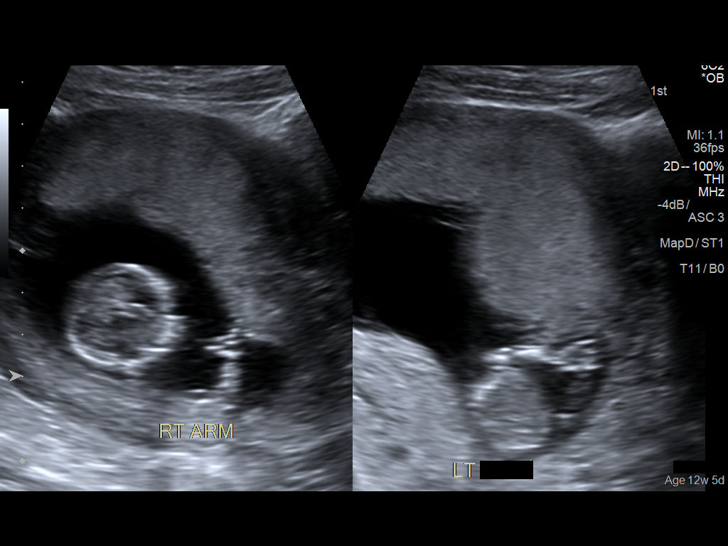

[13 of 28 positions shown; findings below may reference images not displayed]

IMPRESSION: Ms. Benedicta presents for genetic counseling and first trimester
screen.

Ultrasound demonstrates a single live pregnancy at 13 [DATE]
weeks by today's scan. Ms. Benedicta has a sure LMP of 04/16/16
but has a 25 day cycle, which would change the calculated
EDC by her LMP from 01/22/16 to 01/19/16 (12 [DATE] weeks).
Visualization of fetal anatomy is limited by early gestational
age. The best nuchal translucency is 2.5 mm.  The ovaries
were not able to be visualized.

The findings were discussed. Serum analytes were sent to
complete the screen.

## 2017-05-14 ENCOUNTER — Emergency Department
Admission: EM | Admit: 2017-05-14 | Discharge: 2017-05-14 | Disposition: A | Discharge status: Home / Self Care | Payer: BLUE CROSS/BLUE SHIELD | Attending: Emergency Medicine | Admitting: Emergency Medicine

## 2017-05-14 ENCOUNTER — Emergency Department: Payer: BLUE CROSS/BLUE SHIELD

## 2017-05-14 ENCOUNTER — Encounter: Payer: Self-pay | Admitting: Emergency Medicine

## 2017-05-14 DIAGNOSIS — F17200 Nicotine dependence, unspecified, uncomplicated: Secondary | ICD-10-CM | POA: Insufficient documentation

## 2017-05-14 DIAGNOSIS — K8051 Calculus of bile duct without cholangitis or cholecystitis with obstruction: Secondary | ICD-10-CM | POA: Insufficient documentation

## 2017-05-14 DIAGNOSIS — R1011 Right upper quadrant pain: Secondary | ICD-10-CM | POA: Diagnosis present

## 2017-05-14 DIAGNOSIS — R109 Unspecified abdominal pain: Secondary | ICD-10-CM

## 2017-05-14 DIAGNOSIS — F410 Panic disorder [episodic paroxysmal anxiety] without agoraphobia: Secondary | ICD-10-CM | POA: Insufficient documentation

## 2017-05-14 DIAGNOSIS — K8050 Calculus of bile duct without cholangitis or cholecystitis without obstruction: Secondary | ICD-10-CM

## 2017-05-14 DIAGNOSIS — N3000 Acute cystitis without hematuria: Secondary | ICD-10-CM | POA: Insufficient documentation

## 2017-05-14 LAB — COMPREHENSIVE METABOLIC PANEL
ALT: 12 U/L — AB (ref 14–54)
AST: 18 U/L (ref 15–41)
Albumin: 4 g/dL (ref 3.5–5.0)
Alkaline Phosphatase: 76 U/L (ref 38–126)
Anion gap: 8 (ref 5–15)
BILIRUBIN TOTAL: 0.6 mg/dL (ref 0.3–1.2)
BUN: 10 mg/dL (ref 6–20)
CO2: 27 mmol/L (ref 22–32)
Calcium: 9.4 mg/dL (ref 8.9–10.3)
Chloride: 105 mmol/L (ref 101–111)
Creatinine, Ser: 0.94 mg/dL (ref 0.44–1.00)
Glucose, Bld: 102 mg/dL — ABNORMAL HIGH (ref 65–99)
POTASSIUM: 3.5 mmol/L (ref 3.5–5.1)
Sodium: 140 mmol/L (ref 135–145)
Total Protein: 7.7 g/dL (ref 6.5–8.1)

## 2017-05-14 LAB — CBC
HCT: 34.1 % — ABNORMAL LOW (ref 35.0–47.0)
Hemoglobin: 11.6 g/dL — ABNORMAL LOW (ref 12.0–16.0)
MCH: 26.5 pg (ref 26.0–34.0)
MCHC: 34 g/dL (ref 32.0–36.0)
MCV: 77.8 fL — ABNORMAL LOW (ref 80.0–100.0)
PLATELETS: 278 10*3/uL (ref 150–440)
RBC: 4.39 MIL/uL (ref 3.80–5.20)
RDW: 15.4 % — AB (ref 11.5–14.5)
WBC: 8.6 10*3/uL (ref 3.6–11.0)

## 2017-05-14 LAB — POCT PREGNANCY, URINE: Preg Test, Ur: NEGATIVE

## 2017-05-14 LAB — URINALYSIS, COMPLETE (UACMP) WITH MICROSCOPIC
BILIRUBIN URINE: NEGATIVE
GLUCOSE, UA: NEGATIVE mg/dL
HGB URINE DIPSTICK: NEGATIVE
Ketones, ur: NEGATIVE mg/dL
NITRITE: NEGATIVE
PH: 6 (ref 5.0–8.0)
Protein, ur: 30 mg/dL — AB
SPECIFIC GRAVITY, URINE: 1.02 (ref 1.005–1.030)

## 2017-05-14 LAB — LIPASE, BLOOD: Lipase: 22 U/L (ref 11–51)

## 2017-05-14 MED ORDER — SODIUM CHLORIDE 0.9 % IV BOLUS (SEPSIS)
1000.0000 mL | Freq: Once | INTRAVENOUS | Status: AC
  Administered 2017-05-14: 1000 mL via INTRAVENOUS

## 2017-05-14 MED ORDER — OXYCODONE-ACETAMINOPHEN 5-325 MG PO TABS: 1.0000 | ORAL_TABLET | Freq: Four times a day (QID) | ORAL | 0 refills | Status: AC | PRN

## 2017-05-14 MED ORDER — MORPHINE SULFATE (PF) 4 MG/ML IV SOLN
4.0000 mg | Freq: Once | INTRAVENOUS | Status: AC
  Administered 2017-05-14: 4 mg via INTRAVENOUS
  Filled 2017-05-14: qty 1

## 2017-05-14 MED ORDER — MORPHINE SULFATE (PF) 4 MG/ML IV SOLN
INTRAVENOUS | Status: AC
  Filled 2017-05-14: qty 1

## 2017-05-14 MED ORDER — OXYCODONE-ACETAMINOPHEN 5-325 MG PO TABS
1.0000 | ORAL_TABLET | ORAL | Status: DC | PRN
  Administered 2017-05-14: 1 via ORAL
  Filled 2017-05-14: qty 1

## 2017-05-14 MED ORDER — CEPHALEXIN 500 MG PO CAPS: 500.0000 mg | ORAL_CAPSULE | Freq: Three times a day (TID) | ORAL | 0 refills | Status: AC

## 2017-05-14 MED ORDER — MORPHINE SULFATE (PF) 4 MG/ML IV SOLN: 4.0000 mg | Freq: Once | INTRAVENOUS | Status: DC

## 2017-05-14 MED ORDER — LORAZEPAM 2 MG/ML IJ SOLN
INTRAMUSCULAR | Status: AC
  Administered 2017-05-14: 1 mg via INTRAVENOUS
  Filled 2017-05-14: qty 1

## 2017-05-14 MED ORDER — LORAZEPAM 2 MG/ML IJ SOLN
1.0000 mg | Freq: Once | INTRAMUSCULAR | Status: AC
  Administered 2017-05-14: 1 mg via INTRAVENOUS

## 2017-05-14 MED ORDER — ONDANSETRON HCL 4 MG PO TABS: 4.0000 mg | ORAL_TABLET | Freq: Three times a day (TID) | ORAL | 0 refills | Status: DC | PRN

## 2017-05-14 NOTE — ED Triage Notes (Signed)
Patient resting with eyes closed. In no acute distress at this time.

## 2017-05-14 NOTE — ED Notes (Signed)
Pt ambulatory to wheelchair. Pt accompanied by husband. Pt and husband verbalized understanding of discharge instructions and prescriptions. Pt A&O x4. Skin warm and dry.

## 2017-05-14 NOTE — ED Triage Notes (Addendum)
Patient with complaint of central upper abdominal pain that she has had intermittently times three weeks. Patient states that the pain become more severe and constant over the past 5 hours. Patient denies nausea or vomiting. Patient took xanax, gas x, tums and motrin at home with no improvement.

## 2017-05-14 NOTE — ED Notes (Signed)
Pt returned from Ultrasound.

## 2017-05-14 NOTE — ED Notes (Signed)
Pt put on 2L [place] per Dr. Alphonzo Lemmings for support during anxiety attack.

## 2017-05-14 NOTE — ED Notes (Signed)
Pt went to [facility]

## 2017-05-14 NOTE — ED Provider Notes (Addendum)
[facility] Provider Note  ____________________________________________   I have reviewed the triage vital signs and the nursing notes.   HISTORY  Chief Complaint Abdominal Pain    HPI Katie Marks is a 31 y.o. female  Who presents today complaining of right upper quadrant abdominal pain since last night. The pain was significant at some points, seems to wax and wane. No vomiting positive nausea. Has had fleeting intermittent episodes of similar pain in the past. Did have fatty food last night. States the pain went away for a while during the night but is coming back again. Denies fever or chills. Does not have a history of gallbladder surgery in the past. She is unsure if fatty foods in particular makes this worse, nothing seems to make it better but the passage of time ,and its own unexpected vagaries. she has not tried to eat since it started. She denies dysuria or urinary frequency or flank pain, she did have surgery on a ovarian cyst years ago but that was a different pain than she's also had a removed IUD many years ago after "left her uterus" the pain that she feels this sharp, nonradiating  Past Medical History:  Diagnosis Date  . ADHD (attention deficit hyperactivity disorder)   . History of panic attacks   . Petit mal (HCC)   . Seizures (HCC) May 20, 2015   petit mal--no medication at present    Patient Active Problem List   Diagnosis Date Noted  . Indication for care in labor or delivery 01/15/2017  . First trimester screening   . Labor and delivery, indication for care 01/28/2015  . Low back pain during pregnancy in third trimester 12/24/2014    Past Surgical History:  Procedure Laterality Date  . BREAST ENHANCEMENT SURGERY    . IUD REMOVAL N/A 05/25/2015   Procedure: INTRAUTERINE DEVICE (IUD) REMOVAL, RIGHT OVARIAN CYSTOTOMY ;  Surgeon: Suzy Bouchard, MD;  Location: [facility] ORS;  Service: Gynecology;   Laterality: N/A;  . LAPAROSCOPY N/A 05/25/2015   Procedure: LAPAROSCOPY DIAGNOSTIC;  Surgeon: Suzy Bouchard, MD;  Location: [facility] ORS;  Service: Gynecology;  Laterality: N/A;  . NO PAST SURGERIES    . OVARIAN CYST REMOVAL      Prior to Admission medications   Medication Sig Start Date End Date Taking? Authorizing Provider  Prenatal Vit-Fe Fumarate-FA (MULTIVITAMIN-PRENATAL) 27-0.8 MG TABS tablet Take 1 tablet by mouth daily at 12 noon.    [provider]    Allergies Patient has no known allergies.  Family History  Problem Relation Age of Onset  . Hypertension Father     Social History Social History  Substance Use Topics  . Smoking status: Current Some Day Smoker  . Smokeless tobacco: Never Used  . Alcohol use No    Review of Systems Constitutional: No fever/chills Eyes: No visual changes. ENT: No sore throat. No stiff neck no neck pain Cardiovascular: Denies chest pain. Respiratory: Denies shortness of breath. Gastrointestinal:   no vomiting.  No diarrhea.  No constipation. Genitourinary: Negative for dysuria. Musculoskeletal: Negative lower extremity swelling Skin: Negative for rash. Neurological: Negative for severe headaches, focal weakness or numbness.   ____________________________________________   PHYSICAL EXAM:  VITAL SIGNS: ED Triage Vitals  Enc Vitals Group     BP 05/14/17 0206 (!) 134/96     Pulse Rate 05/14/17 0206 92     Resp 05/14/17 0206 18     Temp 05/14/17 0206 97.8 F (36.6 C)  Temp Source 05/14/17 0206 Oral     SpO2 05/14/17 0206 100 %     Weight 05/14/17 0207 203 lb (92.1 kg)     Height 05/14/17 0207  (1.702 m)     Head Circumference --      Peak Flow --      Pain Score 05/14/17 0206 10     Pain Loc --      Pain Edu? --      Excl. in GC? --     Constitutional: Alert and oriented. Well appearing and in no acute distress. Eyes: Conjunctivae are normal Head: Atraumatic HEENT: No congestion/rhinnorhea.  Mucous membranes are moist.  Oropharynx non-erythematous Neck:   Nontender with no meningismus, no masses, no stridor Cardiovascular: Normal rate, regular rhythm. Grossly normal heart sounds.  Good peripheral circulation. Respiratory: Normal respiratory effort.  No retractions. Lungs CTAB. Abdominal: Soft and positive tenderness palpation of the right upper quadrant, voluntary guarding. No distention. No guarding Back:  There is no focal tenderness or step off.  there is no midline tenderness there are no lesions noted. there is no CVA tenderness Musculoskeletal: No lower extremity tenderness, no upper extremity tenderness. No joint effusions, no DVT signs strong distal pulses no edema Neurologic:  Normal speech and language. No gross focal neurologic deficits are appreciated.  Skin:  Skin is warm, dry and intact. No rash noted. Psychiatric: Mood and affect are normal. Speech and behavior are normal.  ____________________________________________   LABS (all labs ordered are listed, but only abnormal results are displayed)  Labs Reviewed  COMPREHENSIVE METABOLIC PANEL - Abnormal; Notable for the following:       Result Value   Glucose, Bld 102 (*)    ALT 12 (*)    All other components within normal limits  CBC - Abnormal; Notable for the following:    Hemoglobin 11.6 (*)    HCT 34.1 (*)    MCV 77.8 (*)    RDW 15.4 (*)    All other components within normal limits  URINALYSIS, COMPLETE (UACMP) WITH MICROSCOPIC - Abnormal; Notable for the following:    Color, Urine YELLOW (*)    APPearance CLOUDY (*)    Protein, ur 30 (*)    Leukocytes, UA LARGE (*)    Bacteria, UA RARE (*)    Squamous Epithelial / LPF 6-30 (*)    All other components within normal limits  URINE CULTURE  URINE CULTURE  LIPASE, BLOOD  POC URINE PREG, ED  POCT PREGNANCY, URINE    Pertinent labs  results that were available during my care of the patient were reviewed by me and considered in my medical decision  making (see chart for details). ____________________________________________  EKG  I personally interpreted any EKGs ordered by me or triage sinus rate 64  no acute ST elevation or depression, normal axis ____________________________________________  RADIOLOGY  Pertinent labs & imaging results that were available during my care of the patient were reviewed by me and considered in my medical decision making (see chart for details). If possible, patient and/or family made aware of any abnormal findings. ____________________________________________    PROCEDURES  Procedure(s) performed: None  Procedures  Critical Care performed: None  ____________________________________________   INITIAL IMPRESSION / ASSESSMENT AND PLAN / ED COURSE  Pertinent labs & imaging results that were available during my care of the patient were reviewed by me and considered in my medical decision making (see chart for details).  patient in no acute distress, presents today with  right upper quadrant abdominal pain which is intermittent for the last several weeks and persistent overnight. She has had no fevers or fashion tests are reassuring. She has no urinary symptoms, there is a question of urinary tract infection on her UA, she has no flank pain, we will send a urine culture given this asymptomatic finding. However, I do believe that her findings most likely consistent with gallbladder disease most likely biliary colic, no evidence yet of cholecystitis, ultrasound is pending although does appear to me that there may be a stone lodged in the neck and if so it may discuss with surgery prior to discharge. We'll give her pain medication and IV fluids while waiting results of imaging. Low suspicion for ACS PE dissection, ovarian cyst ectopic pregnancy appendicitis etc. patient is not breast-feeding  ----------------------------------------- 8:24 AM on  05/14/2017 -----------------------------------------  patient is having panic attack according toherself and her husband this is her chronic panic attack symptoms, she feels very anxious and upset. She is tearful, and feels very anxious. This is a chronic and recurrent theme for her according to family and usually she takes lorazepam and feels better. We will give her lorazepam. I have stood by the bedside with her and worked on deep breathing but she still feels very anxious. This is clouding someone her clinical picture. Certainly she also has some abdominal pain but she does not have a surgical abdomen and I don't think that this is from that neither does her family mother does she. I have discussed with Dr. Tonita Cong and is his hope that we can get the patient's pain under control given that there are no other markers for cholecystitis and if that is the case we can hopefully get her home for outpatient care, the patient hopefully would do better with that. We will see if we can do something to diminish her anxiety about the situation and we will continue to observe   ----------------------------------------- 9:25 AM on 05/14/2017 -----------------------------------------  patient resting comfortably after Ativan we will continue to assess  Clinical Course as of May 14 999  Sun May 14, 2017  1610 patient feels much better tolerating by mouth, relax, her husband is driving she understands she must not drive on any sedating medication, extensive return precautions given for all of her symptoms here. We will empiricallytreat her urinary tract infection because if she does require surgery they will need a treated. Urine culture is pending. Patient in no acute distress, she understandsthe disease processes as noted. She has no HI or SI, she will return for any concerning symptoms. Understands the need for follow-up  [JM]    Clinical Course User Index [JM] Jeanmarie Plant, MD    ____________________________________________   FINAL CLINICAL IMPRESSION(S) / ED DIAGNOSES  Final diagnoses:  None      This chart was dictated using voice recognition software.  Despite best efforts to proofread,  errors can occur which can change meaning.      Jeanmarie Plant, MD 05/14/17 [id number]    Jeanmarie Plant, MD 05/14/17 Theodis Blaze    Jeanmarie Plant, MD 05/14/17 5409    Jeanmarie Plant, MD 05/14/17 1000

## 2017-05-14 NOTE — ED Notes (Signed)
Pt successfully completed PO challenge. Pt given water and saltine crackers. No reports of nausea. No vomiting. Pt denies any pain at this time.

## 2017-05-15 LAB — URINE CULTURE
Culture: NO GROWTH
Special Requests: NORMAL

## 2017-05-16 ENCOUNTER — Ambulatory Visit: Payer: Self-pay | Admitting: General Surgery

## 2017-05-16 ENCOUNTER — Telehealth: Payer: Self-pay | Admitting: General Practice

## 2017-05-16 NOTE — Telephone Encounter (Signed)
Spoke with the patient, she stated she has an appointment at [facility] with Dr. Maia Plan and they were scheduling her for surgery sometime next week.

## 2017-05-17 ENCOUNTER — Encounter
Admission: RE | Admit: 2017-05-17 | Discharge: 2017-05-17 | Disposition: A | Discharge status: Home / Self Care | Payer: BLUE CROSS/BLUE SHIELD | Source: Ambulatory Visit | Attending: General Surgery | Admitting: General Surgery

## 2017-05-17 HISTORY — DX: Personal history of Methicillin resistant Staphylococcus aureus infection: Z86.14

## 2017-05-17 HISTORY — DX: Panic disorder (episodic paroxysmal anxiety): F41.0

## 2017-05-17 NOTE — Patient Instructions (Signed)
Your procedure is scheduled on: 05-24-17 [facility] Report to Same Day Surgery 2nd floor medical mall [facility] Entrance-take elevator on left to 2nd floor.  Check in with surgery information desk.) To find out your arrival time please call [phone] between 1PM - 3PM on 05-23-17 TUESDAY  Remember: Instructions that are not followed completely may result in serious medical risk, up to and including death, or upon the discretion of your surgeon and anesthesiologist your surgery may need to be rescheduled.    _x___ 1. Do not eat food after midnight the night before your procedure. NO GUM CHEWING OR HARD CANDIES.  You may drink clear liquids up to 2 hours before you are scheduled to arrive at the hospital for your procedure.  Do not drink clear liquids within 2 hours of your scheduled arrival to the hospital.  Clear liquids include  --Water or Apple juice without pulp  --Clear carbohydrate beverage such as ClearFast or Gatorade  --Black Coffee or Clear Tea (No milk, no creamers, do not add anything to the coffee or Tea)  Type 1 and type 2 diabetics should only drink water.     __x__ 2. No Alcohol for 24 hours before or after surgery.   __x__3. No Smoking for 24 prior to surgery.   ____  4. Bring all medications with you on the day of surgery if instructed.    __x__ 5. Notify your doctor if there is any change in your medical condition     (cold, fever, infections).     Do not wear jewelry, make-up, hairpins, clips or nail polish.  Do not wear lotions, powders, or perfumes. You may wear deodorant.  Do not shave 48 hours prior to surgery. Men may shave face and neck.  Do not bring valuables to the hospital.    [facility] is not responsible for any belongings or valuables.               Contacts, dentures or bridgework may not be worn into surgery.  Leave your suitcase in the car. After surgery it may be brought to your room.  For patients admitted to the hospital, discharge time  is determined by your treatment team.   Patients discharged the day of surgery will not be allowed to drive home.  You will need someone to drive you home and stay with you the night of your procedure.    Please read over the following fact sheets that you were given:   [facility] Preparing for Surgery and or MRSA Information   _x___ Take anti-hypertensive listed below, cardiac, seizure, asthma,     anti-reflux and psychiatric medicines. These include:  1. YOU MAY TAKE XANAX IF NEEDED DAY OF SURGERY WITH A SMALL SIP OF WATER  2.  3.  4.  5.  6.  ____Fleets enema or Magnesium Citrate as directed.   _x___ Use CHG Soap or sage wipes as directed on instruction sheet   ____ Use inhalers on the day of surgery and bring to hospital day of surgery  ____ Stop Metformin and Janumet 2 days prior to surgery.    ____ Take 1/2 of usual insulin dose the night before surgery and none on the morning surgery.   ____ Follow recommendations from Cardiologist, Pulmonologist or PCP regarding stopping Aspirin, Coumadin, Plavix ,Eliquis, Effient, or Pradaxa, and Pletal.  X____Stop Anti-inflammatories such as Advil, Aleve, Ibuprofen, Motrin, Naproxen, Naprosyn, Goodies powders or aspirin products NOW-OK to take Tylenol    ____ Stop supplements  until after surgery.     ____ Bring C-Pap to the hospital.

## 2017-05-18 ENCOUNTER — Ambulatory Visit: Payer: Self-pay | Admitting: General Surgery

## 2017-05-18 NOTE — H&P (Signed)
PATIENT PROFILE: Katie Marks is a 31 y.o. female who presents to the Clinic for consultation at the request of Dr. Seymour Bars for evaluation of cholelithiasis.  PCP:  None  HISTORY OF PRESENT ILLNESS: Katie Marks reports that in the past 3 months has developed abdominal pain weekly. The pain is described as sharp stabbing pain, "worse than the pain during delivery of her last baby". Pain start on the epigastric area, radiates to the right upper quadrant and to the back. Denies nausea or vomiting associated to the pain. Two days ago pain was 10/10. Patient needed to go to ED were pain was managed. Ultrasound showed cholelithiasis without sign of cholecystitis or choledocholithiasis. Pain improved with pain medication. No new episode since 2 days ago. Pain is usually during nights. Denies episodes of fever or chills. No sign of jaundice.    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, and therefore no further testing is indicated      Seizure, petit mal (CMS-HCC) 05/21/2014   ADHD (attention deficit hyperactivity disorder) 05/21/2014      GENERAL REVIEW OF SYSTEMS:   General ROS: negative for - chills, fatigue, fever, weight gain or weight loss Allergy and Immunology ROS: negative for - hives  Hematological and Lymphatic ROS: negative for - bleeding problems or bruising, negative for palpable nodes Endocrine ROS: negative for - heat or cold intolerance, hair changes Respiratory ROS: negative for - cough, shortness of breath or wheezing Cardiovascular ROS: no chest pain or palpitations GI ROS: negative for nausea, vomiting, abdominal pain, diarrhea. Positive for constipation. See HPI for other positive pertinents Musculoskeletal ROS: negative for - joint swelling or muscle pain Neurological ROS: negative for - confusion, syncope.  Positive for anxiety Dermatological ROS: negative for pruritus and rash  MEDICATIONS: CurrentMedications        Current Outpatient Prescriptions  Medication Sig Dispense Refill  . ferrous sulfate 325 (65 FE) MG EC tablet Take 1 tablet (325 mg total) by mouth daily with breakfast. 30 tablet 6   No current facility-administered medications for this visit.       ALLERGIES: Morphine  PAST MEDICAL HISTORY:     Past Medical History:  Diagnosis Date  . Attention deficit hyperactivity disorder (ADHD)   . Seizure, petit mal (CMS-HCC)     PAST SURGICAL HISTORY:      Past Surgical History:  Procedure Laterality Date  . AUGMENTATION MAMMAPLASTY    . Laparoscopic IUD retrieval and L ovarian cystectomy  05/25/2015     FAMILY HISTORY:      Family History  Problem Relation Age of Onset  . Deep vein thrombosis (DVT or abnormal blood clot formation) Mother      SOCIAL HISTORY: Social History        Social History  . Marital status: Married    Spouse name: N/A  . Number of children: N/A  . Years of education: N/A        Social History Main Topics  . Smoking status: Former Games developer  . Smokeless tobacco: Never Used  . Alcohol use No  . Drug use: No  . Sexual activity: Yes    Partners: Male    Birth control/ protection: None       Other Topics Concern  . None  Social History Narrative  . None    PHYSICAL EXAM: Vitals:   05/15/17 1405  BP: 121/87  Pulse: 96  Temp: 37.3 C (99.2 F)   Body mass index is 33.1 kg/m. Weight: 96.2 kg (212 lb)   General Appearance:    Alert, cooperative, no distress, appears stated age  Head:     Atraumatic, normocephalic  Eyes:   Anciteric, no erythema, no secretions  Neck:   Supple, symmetrical, no JVD, no palpable lymph nodes  Mouth:   Lips, mucosa, and tongue normal;   Lungs:     Clear to auscultation bilaterally, respirations unlabored   Heart:    Regular rate and rhythm, S1 and S2 normal,  no murmur, rub   or gallop  Abdomen:     Soft, non-tender, bowel sounds active all four quadrants,    no masses, no organomegaly  Extremities:   Extremities normal, atraumatic, no cyanosis or edema  Skin:   Skin color, texture, turgor normal, no rashes or lesions   Neurologic:   Grossly intact.    REVIEW OF DATA: I have reviewed the following data today:      Procedure visit on 02/27/2017  Component Date Value  . Age Cathie Hoops ACOG Testing - * 02/27/2017 21-29   . DIAGNOSIS: - [facility] 02/27/2017 Comment   . Specimen adequacy: - Lab* 02/27/2017 Comment   . Clinician provided ICD10* 02/27/2017 Comment   . PERFORMED BY: - [facility] 02/27/2017 Comment   . . - [facility] 02/27/2017 .   Marland Kitchen Note: - [facility] 02/27/2017 Comment   . Test Methodology: - [facility] 02/27/2017 Comment   . . - Verdell Carmine 02/27/2017 Comment      ASSESSMENT: Katie Marks is a 31 y.o. female presenting for consultation for cholelithiasis. At this moment patient presents with gallstones that has caused multiple episodes of biliary colic, last one causing her to seek medical attention an the ED. No previous history of confirmed cholecystitis. On laboratory workup, patient with normal bilirubin levels, no elevated WBC count, and negative wound culture. Patient was oriented about the diagnosis of cholelithiasis, the implication of having gallstone, the treatment alternatives, surgical procedures, complications of procedure and risks of not having procedures. Attached a copy of the information handed to the patient.    PLAN: 1.Laparoscopic Cholecystectomy (CPT: O4060964) 2.Avoid fatty food 3.Follow clinic staff instruction for pre admission process  Patient and/or representative verbalized understanding, all questions were answered, and were agreeable with the plan outlined above.   Carolan Shiver, MD  Electronically signed by Carolan Shiver, MD  Patient Information Handout Cholecystectomy (Surgical Removal of the  Gallbladder)  The gallbladder is a small organ under the liver that stores the bile produced by the liver  Gallstones are hardened digestive fluid that can for in the gallbladder  The procedure is done to remove the gallbladder due to gallstone causing pain or infection     Complications:  Retained common bile duct stone 4%  Bile duct injury or leak 0.5%  Wound infection 1%  Bleeding <1%  Pneumonia 0.2%  Heart complications 0.1%  Blood clots (DVT): 0.2%  Death 0.1%  Alternatives  Watchful waiting is recommended if patient has no symptoms   Low fat diet and exercise  Risks of not having surgery  2% to 3% per year of those with known asymptomatic gallstones will develop symptoms  5% to 30% per year of those with symptomatic gallstone will develop symptoms requiring cholecystectomy 1% to 2% will develop cholecystitis, pancreatitis or cholangitis. May need  urgent/emergent surgery    Patient Liability Agreement         Scan on 05/15/2017 12:00 AM by Harriet Masson, MDScan on 05/15/2017 12:00 AM by Hazle Quant, Merita Norton, MD   Instructions   1.Laparoscopic Cholecystectomy (CPT: O4060964) 2.Avoid fatty food 3.Follow clinic staff instruction for pre admission process

## 2017-05-18 NOTE — Pre-Procedure Instructions (Signed)
CALLED AND SPOKE WITH EMILY (NURSE) AT DR Jake Michaelis OFFICE REGARDING PT NEEDING NEUROLOGY CLEARANCE. MADE EMILY AWARE THAT PT DOES NOT SEE A NEUROLOGIST.  FAXED CLEARANCE PAPERWORK OVER TO EMILY WITH FAX CONFIRMATION RECEIVED

## 2017-05-18 NOTE — Pre-Procedure Instructions (Addendum)
SPOKE WITH DR Henrene Hawking REGARDING PT HAVING DAILY PETIT MAL SEIZURES. PT TAKING NO MEDICATIONS FOR HER SEIZURES AND DOES NOT SEE A NEUROLOGIST.  DR Henrene Hawking WANTING NEUROLOGY CLEARANCE

## 2017-05-22 ENCOUNTER — Encounter
Admission: RE | Admit: 2017-05-22 | Discharge: 2017-05-22 | Disposition: A | Discharge status: Home / Self Care | Payer: BLUE CROSS/BLUE SHIELD | Source: Ambulatory Visit | Attending: General Surgery | Admitting: General Surgery

## 2017-05-22 DIAGNOSIS — Z01818 Encounter for other preprocedural examination: Secondary | ICD-10-CM | POA: Diagnosis not present

## 2017-05-22 LAB — SURGICAL PCR SCREEN
MRSA, PCR: NEGATIVE
STAPHYLOCOCCUS AUREUS: NEGATIVE

## 2017-05-24 ENCOUNTER — Ambulatory Visit: Admission: RE | Admit: 2017-05-24 | Payer: BLUE CROSS/BLUE SHIELD | Source: Ambulatory Visit | Admitting: *Deleted

## 2017-05-24 ENCOUNTER — Encounter: Admission: RE | Payer: Self-pay | Source: Ambulatory Visit

## 2017-05-24 SURGERY — LAPAROSCOPIC CHOLECYSTECTOMY
Anesthesia: Choice

## 2017-06-19 ENCOUNTER — Inpatient Hospital Stay: Admission: RE | Admit: 2017-06-19 | Payer: BLUE CROSS/BLUE SHIELD | Source: Ambulatory Visit

## 2017-06-26 ENCOUNTER — Encounter: Admission: RE | Payer: Self-pay | Source: Ambulatory Visit

## 2017-06-26 ENCOUNTER — Ambulatory Visit
Admission: RE | Admit: 2017-06-26 | Payer: BLUE CROSS/BLUE SHIELD | Source: Ambulatory Visit | Admitting: General Surgery

## 2017-06-26 SURGERY — LAPAROSCOPIC CHOLECYSTECTOMY
Anesthesia: Choice

## 2018-03-08 IMAGING — US US ABDOMEN LIMITED
1 series · 14 of 25 positions shown · non-contrast
Comparison: None applicable

CLINICAL DATA: Right upper quadrant pain for weeks.

EXAM:
ULTRASOUND ABDOMEN LIMITED RIGHT UPPER QUADRANT

[Series 1: us abdomen limited · 0.33mm/px · 14 of 34 slices shown]
[im 1/34]
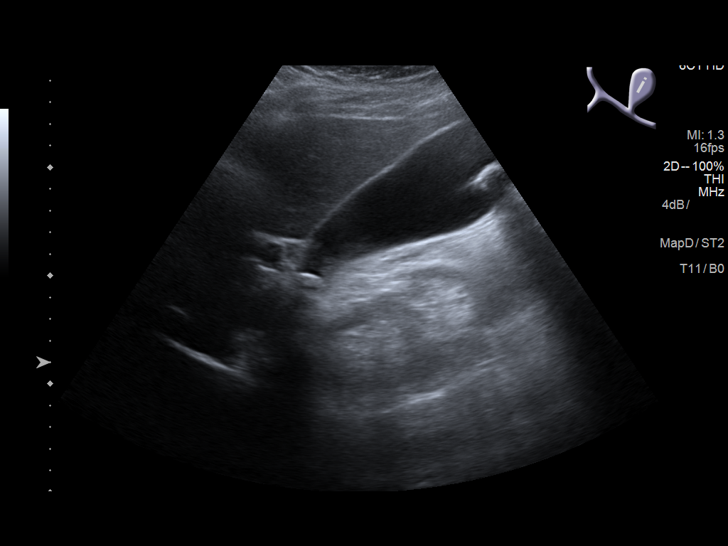
[im 3/34]
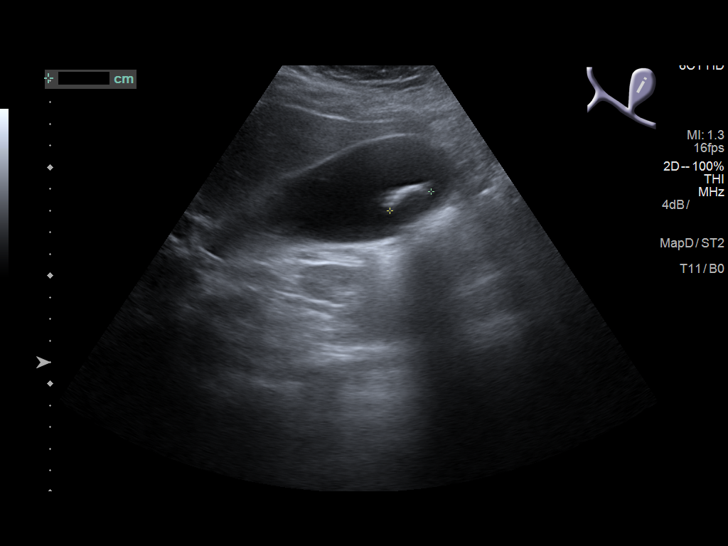
[im 6/34]
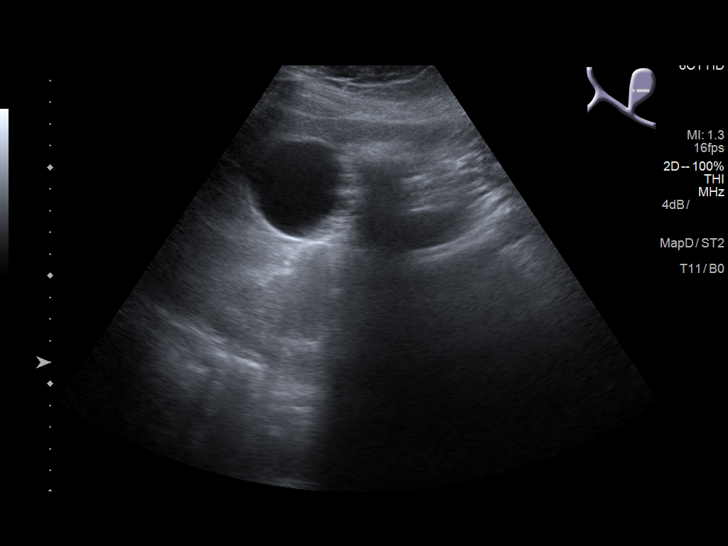
[im 9/34]
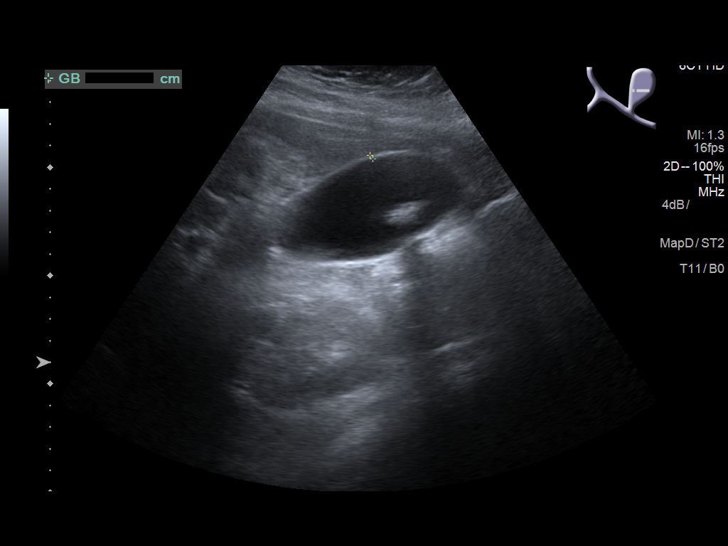
[im 12/34]
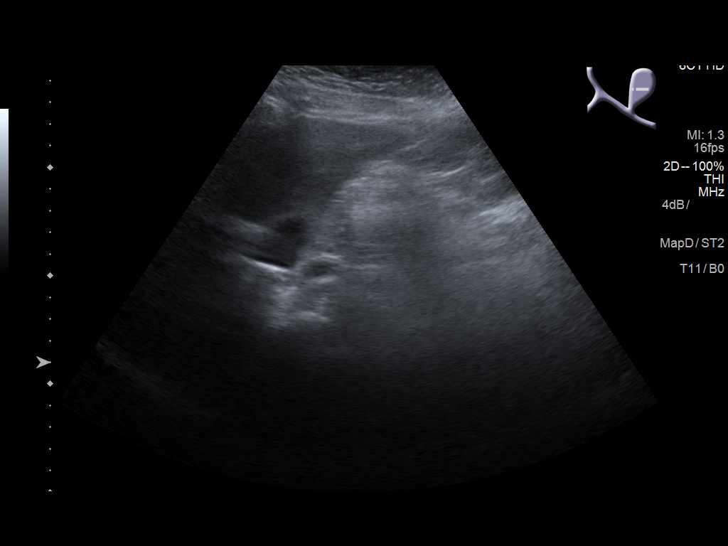
[im 13/34]
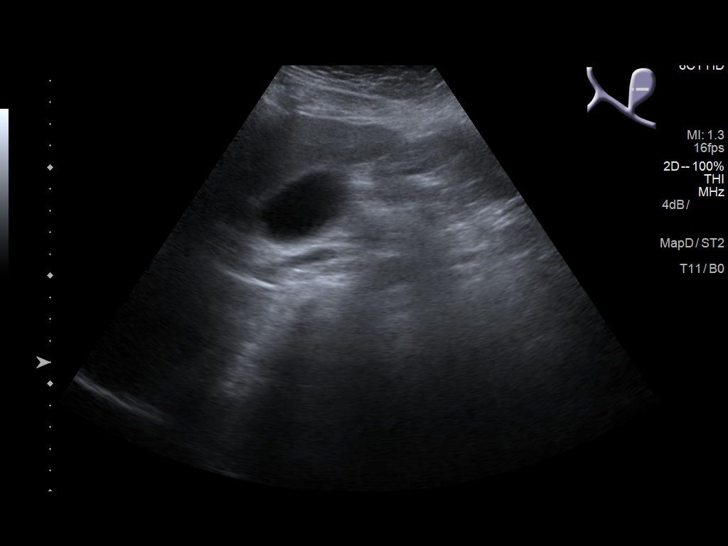
[im 16/34]
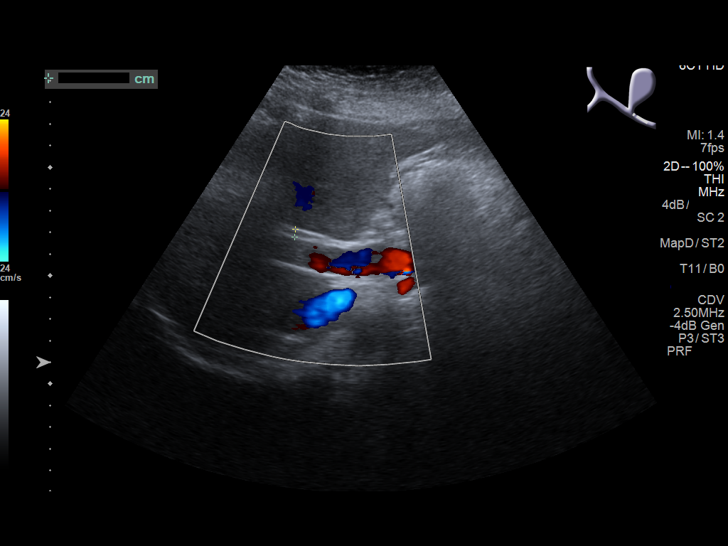
[im 18/34]
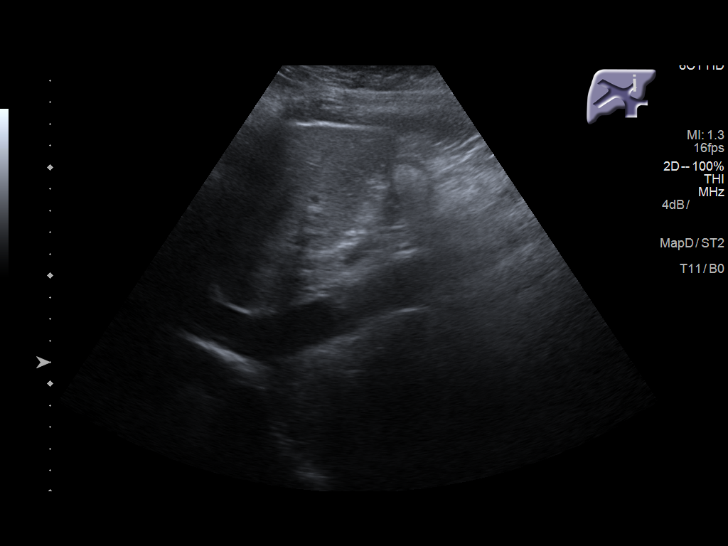
[im 21/34]
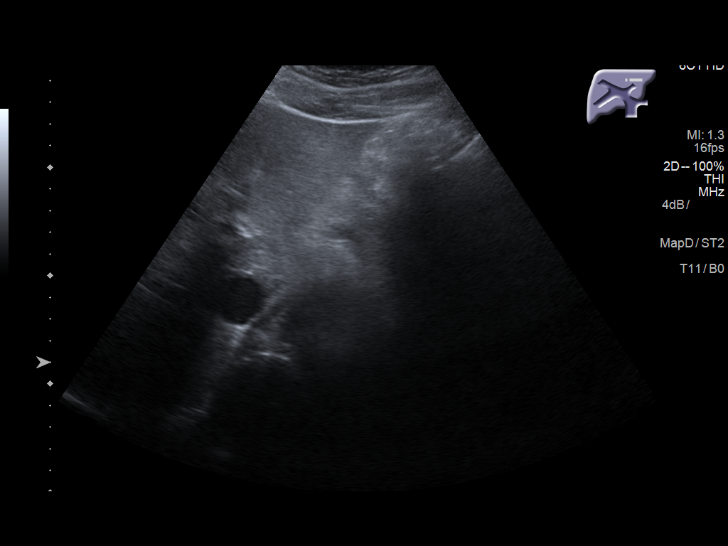
[im 23/34]
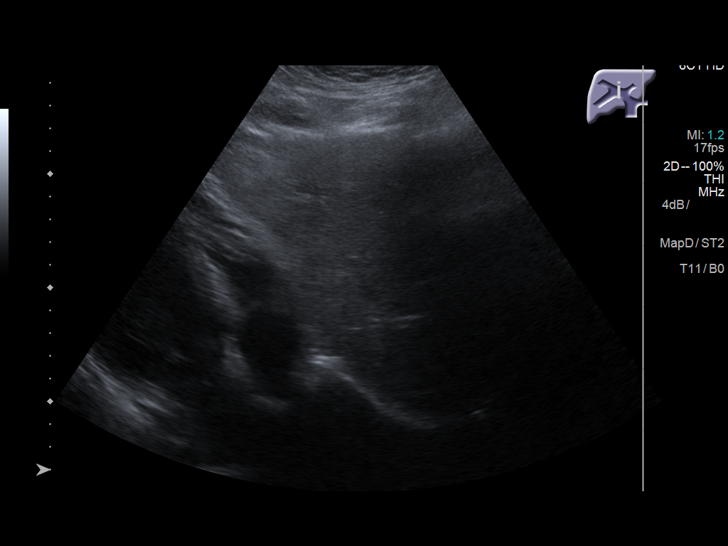
[im 25/34]
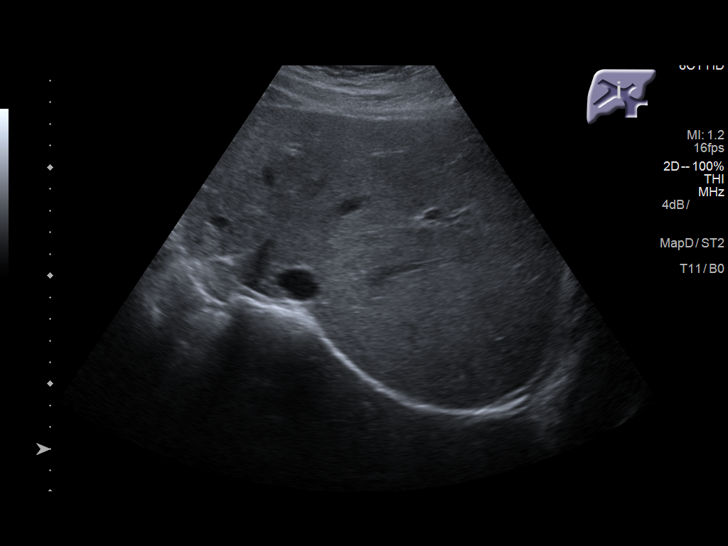
[im 28/34]
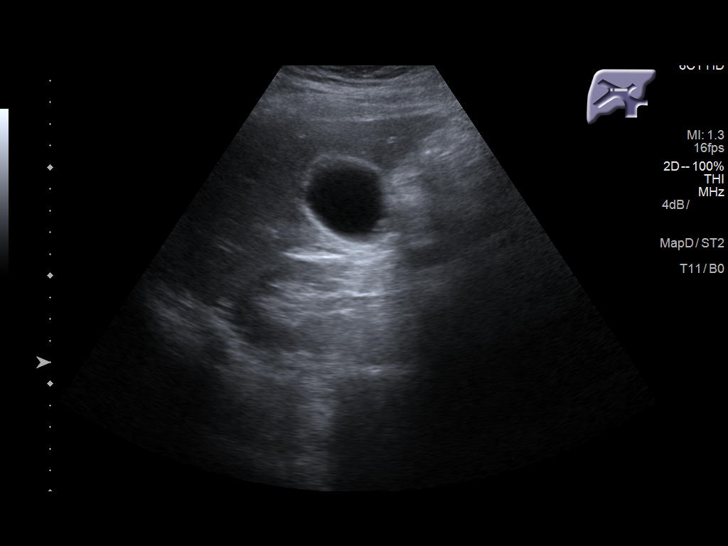
[im 31/34]
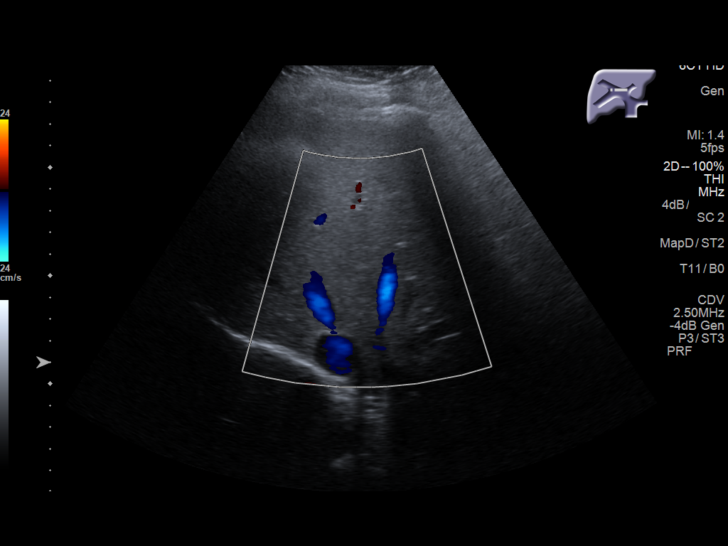
[im 34/34]
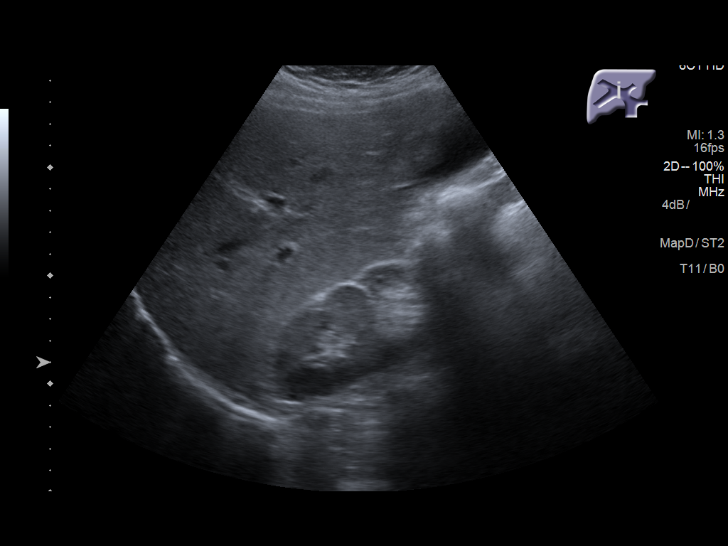

[14 of 25 positions shown; findings below may reference images not displayed]

FINDINGS: Gallbladder:

Gallbladder wall is normal in thickness. No pericholecystic fluid.
No sonographic Murphy sign. A a stone in the gallbladder neck is
nonmobile and measures 1.2 cm. A second stone is identified in the
gallbladder fundus measuring 2.1 cm.

Common bile duct:

Diameter: 3.6 mm

Liver:

No focal lesion identified. Within normal limits in parenchymal
echogenicity. Portal vein is patent on color Doppler imaging with
normal direction of blood flow towards the liver.
IMPRESSION: 1. Cholelithiasis. 1.2 cm gallstone lodged within the gallbladder
neck may predispose patient to acute cholecystitis.
2. No other ultrasound features of acute cholecystitis.
3. Normal appearance of the liver.

## 2019-08-16 NOTE — L&D Delivery Note (Signed)
Delivery Note At 5:42 PM a viable and healthy female "Katie Marks" was delivered via Vaginal, Spontaneous (Presentation: Left Occiput Anterior).  APGAR: 7, 9; weight pending.   Placenta status: Spontaneous, Intact.  Cord: 3 vessels with the following complications: nuchal x1, reduced on the perineum.    Anesthesia: None Episiotomy: None Lacerations: 1st degree Suture Repair: 2.0 vicryl Est. Blood Loss (mL):  200  Mom to postpartum.  Baby to Couplet care / Skin to Skin.  33yo [id number] at 39+0wks with elective IOL at term with cytotec x1, pitocin and AROM. She progressed quickly to fully dilated without epidural, and pushed over intact perineum. Nuchal x1, reduced on perineum, and delivered quickly. Baby placed on maternal abdomen, vigorously crying. 1st deg repaired in standard fashion. Fundus firm, bleeding minimal. Pt and baby tolerated procedure well.  Katie Marks 04/27/2020, 5:57 PM

## 2019-10-10 LAB — OB RESULTS CONSOLE GC/CHLAMYDIA
Chlamydia: NEGATIVE
Gonorrhea: NEGATIVE

## 2019-10-10 LAB — OB RESULTS CONSOLE RUBELLA ANTIBODY, IGM: Rubella: IMMUNE

## 2019-10-10 LAB — OB RESULTS CONSOLE VARICELLA ZOSTER ANTIBODY, IGG: Varicella: IMMUNE

## 2019-10-10 LAB — OB RESULTS CONSOLE HEPATITIS B SURFACE ANTIGEN: Hepatitis B Surface Ag: NEGATIVE

## 2019-12-28 ENCOUNTER — Encounter: Payer: Self-pay | Admitting: Obstetrics and Gynecology

## 2019-12-28 ENCOUNTER — Observation Stay
Admission: EM | Admit: 2019-12-28 | Discharge: 2019-12-28 | Disposition: A | Discharge status: Home / Self Care | Payer: 59 | Attending: Obstetrics and Gynecology | Admitting: Obstetrics and Gynecology

## 2019-12-28 ENCOUNTER — Other Ambulatory Visit: Payer: Self-pay

## 2019-12-28 DIAGNOSIS — R1013 Epigastric pain: Secondary | ICD-10-CM | POA: Insufficient documentation

## 2019-12-28 DIAGNOSIS — O99332 Smoking (tobacco) complicating pregnancy, second trimester: Secondary | ICD-10-CM | POA: Insufficient documentation

## 2019-12-28 DIAGNOSIS — K219 Gastro-esophageal reflux disease without esophagitis: Secondary | ICD-10-CM | POA: Diagnosis not present

## 2019-12-28 DIAGNOSIS — O99342 Other mental disorders complicating pregnancy, second trimester: Secondary | ICD-10-CM | POA: Diagnosis not present

## 2019-12-28 DIAGNOSIS — Z79899 Other long term (current) drug therapy: Secondary | ICD-10-CM | POA: Insufficient documentation

## 2019-12-28 DIAGNOSIS — Z3A21 21 weeks gestation of pregnancy: Secondary | ICD-10-CM | POA: Insufficient documentation

## 2019-12-28 DIAGNOSIS — G40A09 Absence epileptic syndrome, not intractable, without status epilepticus: Secondary | ICD-10-CM | POA: Diagnosis not present

## 2019-12-28 DIAGNOSIS — O26892 Other specified pregnancy related conditions, second trimester: Secondary | ICD-10-CM | POA: Diagnosis not present

## 2019-12-28 DIAGNOSIS — F909 Attention-deficit hyperactivity disorder, unspecified type: Secondary | ICD-10-CM | POA: Diagnosis not present

## 2019-12-28 DIAGNOSIS — R109 Unspecified abdominal pain: Secondary | ICD-10-CM | POA: Diagnosis present

## 2019-12-28 DIAGNOSIS — O99352 Diseases of the nervous system complicating pregnancy, second trimester: Secondary | ICD-10-CM | POA: Diagnosis not present

## 2019-12-28 DIAGNOSIS — F17210 Nicotine dependence, cigarettes, uncomplicated: Secondary | ICD-10-CM | POA: Insufficient documentation

## 2019-12-28 LAB — COMPREHENSIVE METABOLIC PANEL
ALT: 13 U/L (ref 0–44)
AST: 16 U/L (ref 15–41)
Albumin: 3.3 g/dL — ABNORMAL LOW (ref 3.5–5.0)
Alkaline Phosphatase: 67 U/L (ref 38–126)
Anion gap: 6 (ref 5–15)
BUN: 6 mg/dL (ref 6–20)
CO2: 23 mmol/L (ref 22–32)
Calcium: 8.6 mg/dL — ABNORMAL LOW (ref 8.9–10.3)
Chloride: 108 mmol/L (ref 98–111)
Creatinine, Ser: 0.68 mg/dL (ref 0.44–1.00)
GFR calc Af Amer: >60 mL/min (ref >60)
GFR calc non Af Amer: >60 mL/min (ref >60)
Glucose, Bld: 84 mg/dL (ref 70–99)
Potassium: 3.7 mmol/L (ref 3.5–5.1)
Sodium: 137 mmol/L (ref 135–145)
Total Bilirubin: 0.7 mg/dL (ref 0.3–1.2)
Total Protein: 6.6 g/dL (ref 6.5–8.1)

## 2019-12-28 LAB — CBC
HCT: 31.8 % — ABNORMAL LOW (ref 36.0–46.0)
Hemoglobin: 10.9 g/dL — ABNORMAL LOW (ref 12.0–15.0)
MCH: 31.1 pg (ref 26.0–34.0)
MCHC: 34.3 g/dL (ref 30.0–36.0)
MCV: 90.9 fL (ref 80.0–100.0)
Platelets: 236 10*3/uL (ref 150–400)
RBC: 3.5 MIL/uL — ABNORMAL LOW (ref 3.87–5.11)
RDW: 13.1 % (ref 11.5–15.5)
WBC: 7.9 10*3/uL (ref 4.0–10.5)
nRBC: 0 % (ref 0.0–0.2)

## 2019-12-28 MED ORDER — ALUM & MAG HYDROXIDE-SIMETH 200-200-20 MG/5ML PO SUSP
30.0000 mL | Freq: Once | ORAL | Status: AC
  Administered 2019-12-28: 30 mL via ORAL
  Filled 2019-12-28: qty 30

## 2019-12-28 MED ORDER — ACETAMINOPHEN 500 MG PO TABS
1000.0000 mg | ORAL_TABLET | Freq: Once | ORAL | Status: DC
Start: 2019-12-28 — End: 2019-12-28

## 2019-12-28 MED ORDER — FAMOTIDINE 20 MG PO TABS
20.0000 mg | ORAL_TABLET | Freq: Once | ORAL | Status: AC
  Administered 2019-12-28: 20 mg via ORAL
  Filled 2019-12-28: qty 1

## 2019-12-28 MED ORDER — ACETAMINOPHEN 500 MG PO TABS
1000.0000 mg | ORAL_TABLET | Freq: Once | ORAL | Status: AC
  Administered 2019-12-28: 1000 mg via ORAL

## 2019-12-28 MED ORDER — ACETAMINOPHEN 500 MG PO TABS
ORAL_TABLET | ORAL | Status: AC
  Filled 2019-12-28: qty 2

## 2019-12-28 NOTE — Discharge Summary (Signed)
Katie Marks is a 34 y.o. female. She is at [redacted]w[redacted]d gestation. No LMP recorded. Patient is pregnant. Estimated Date of Delivery: 05/04/20  Prenatal care site: [facility]  Current pregnancy complicated by:  1. Petit Mal Seizures  Referral sent to MFM   Neurologist: Ms Kristine Linea NP  Last appt 04/28/20  Taking Lamictal 150mg  BID  10/10/19 - Levels: 5.3  Need q trimester levels  Taking Folic Acid 4 mg   AFP test 15-21 weeks  2. H/o polyhydramnios - G1 3. 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 H/o cholelithiasis Val[place] ] Gallbladder u/s 11/08/19- no obstruction   Chief complaint: upper to midabdominal pain, concerned that she may have Pre-eclampsia after she did some internet research on her symptoms.   Location: upper epigastric pain  Onset/timing: since this am Duration: constant Quality: severe sharp pain Severity: unable to give a pain scale, "I have a high pain tolerance"  Aggravating or alleviating conditions: Ate fast food from [employer] last night. Reports that pain does not worsen when she eats a spicy chicken sandwich.  Associated signs/symptoms: denies nausea or vomiting, no urinary or genital sx. No contractions.  Context: Prior hx cholelithiasis, was told to have GB removed after 1st baby but never followed up.   S: Resting comfortably. no CTX, no VB.no LOF,  Active fetal movement.  Denies: HA, visual changes, SOB, or RUQ/epigastric pain  Maternal Medical History:   Past Medical History:  Diagnosis Date  . ADHD (attention deficit hyperactivity disorder)   . Anemia    H/O  . GERD (gastroesophageal reflux disease)    OCC-TUMS PRN  . History of methicillin resistant staphylococcus aureus (MRSA) 2012  . History of panic attacks   . Panic attacks   . Petit mal ([place])   . Seizures ([place]) May 20, 2015   petit mal--no medication at present-PT [place] AND THEY LAST FOR 1-2 SEC    Past Surgical History:   Procedure Laterality Date  . IUD REMOVAL N/A 05/25/2015   Procedure: INTRAUTERINE DEVICE (IUD) REMOVAL, RIGHT OVARIAN CYSTOTOMY ;  Surgeon: Boykin Nearing, MD;  Location: [facility] ORS;  Service: Gynecology;  Laterality: N/A;  . LAPAROSCOPY N/A 05/25/2015   Procedure: LAPAROSCOPY DIAGNOSTIC;  Surgeon: Boykin Nearing, MD;  Location: [facility] ORS;  Service: Gynecology;  Laterality: N/A;  . NO PAST SURGERIES    . OVARIAN CYST REMOVAL      Allergies  Allergen Reactions  . Morphine Anxiety    Prior to Admission medications   Medication Sig Start Date End Date Taking? Authorizing Provider  ALPRAZolam Duanne Moron) 0.5 MG tablet Take 0.5 mg by mouth daily as needed for anxiety.    Yes [provider]  calcium carbonate (TUMS) 500 MG chewable tablet Chew 1-2 tablets by mouth as needed for indigestion or heartburn.    Yes [provider]  methylphenidate (METADATE CD) 60 MG CR capsule Take 60 mg by mouth daily.  04/20/17  Yes [provider]  cefUROXime (CEFTIN) 250 MG tablet Take 250 mg by mouth 2 (two) times daily with a meal. For 10 days 06/09/17   [provider]  naproxen sodium (ANAPROX) 220 MG tablet Take 220 mg by mouth 2 (two) times daily as needed (for pain or headache).    [provider]  ondansetron (ZOFRAN) 4 MG tablet Take 1 tablet (4 mg total) by mouth every 8 (eight) hours as needed for nausea or vomiting. Patient not taking: Reported on  05/16/2017 05/14/17   Jeanmarie Plant, MD      Social History: She  reports that she has been smoking cigarettes. She has smoked for the past 5.00 years. She has never used smokeless tobacco. She reports current alcohol use. She reports that she does not use drugs.  Family History: family history includes Hypertension in her father.   Review of Systems: A full review of systems was performed and negative except as noted in the HPI.     O:  BP 111/66   Pulse 89   Temp 98.6 F (37 C) (Oral)   Results for orders placed or performed during the hospital encounter of 12/28/19 (from the past 48 hour(s))  Comprehensive metabolic panel   Collection Time: 12/28/19  4:53 PM  Result Value Ref Range   Sodium 137 135 - 145 mmol/L   Potassium 3.7 3.5 - 5.1 mmol/L   Chloride 108 98 - 111 mmol/L   CO2 23 22 - 32 mmol/L   Glucose, Bld 84 70 - 99 mg/dL   BUN 6 6 - 20 mg/dL   Creatinine, Ser 6.27 0.44 - 1.00 mg/dL   Calcium 8.6 (L) 8.9 - 10.3 mg/dL   Total Protein 6.6 6.5 - 8.1 g/dL   Albumin 3.3 (L) 3.5 - 5.0 g/dL   AST 16 15 - 41 U/L   ALT 13 0 - 44 U/L   Alkaline Phosphatase 67 38 - 126 U/L   Total Bilirubin 0.7 0.3 - 1.2 mg/dL   GFR calc non Af Amer >60 >60 mL/min   GFR calc Af Amer >60 >60 mL/min   Anion gap 6 5 - 15  CBC   Collection Time: 12/28/19  4:53 PM  Result Value Ref Range   WBC 7.9 4.0 - 10.5 K/uL   RBC 3.50 (L) 3.87 - 5.11 MIL/uL   Hemoglobin 10.9 (L) 12.0 - 15.0 g/dL   HCT 03.5 (L) 00.9 - 38.1 %   MCV 90.9 80.0 - 100.0 fL   MCH 31.1 26.0 - 34.0 pg   MCHC 34.3 30.0 - 36.0 g/dL   RDW 82.9 93.7 - 16.9 %   Platelets 236 150 - 400 K/uL   nRBC 0.0 0.0 - 0.2 %     Constitutional: NAD, AAOx3  HE/ENT: extraocular movements grossly intact, moist mucous membranes CV: RRR PULM: nl respiratory effort, CTABL     Abd: gravid, non-distended, soft, slight tenderness noted over RUQ and midline upper abdominal pain. Fundus non tender.       Ext: Non-tender, Nonedematous   Psych: mood appropriate, speech normal Pelvic: deferred  TOCO: no UCs noted  FHR 145bpm by doppler, active FM noted.     A/P: 34 y.o. [redacted]w[redacted]d here for antenatal surveillance for epigastric pain in pregnancy  Principle Diagnosis:  22wks, epigastric pain in pregnancy   Preterm labor: not present.   Fetal Wellbeing: Reassuring Doppler FHR, no uterine contractions.   Reviewed RUQ US done approx 6wks ago at [facility], showing cholelithiasis but no blockage.   Reviewed CMP and CBC WNL.   Given GI cocktail,  Pepcid and tylenol. Discussed diet for hx GB disease, will need f/u with gen surgery.   D/c home stable, precautions reviewed, follow-up as scheduled.    Randa Ngo, CNM 12/28/2019  5:53 PM

## 2019-12-28 NOTE — OB Triage Note (Signed)
Patient here for complaints of upper abdominal pain. She is unable to rate this pain from 0-10  As she states that she has a high pain tolerance. She has no other complaints. Denies LOF and bleeding and does not think this is contractions. Fetal heart tones done 140's with accels, toco applied.

## 2019-12-28 NOTE — Progress Notes (Signed)
Patient discharged to home with discharge instructions and to follow up at [facility] on Monday. Patient verbalized understanding and knows when to return to hospital.

## 2020-04-06 LAB — OB RESULTS CONSOLE HIV ANTIBODY (ROUTINE TESTING): HIV: NONREACTIVE

## 2020-04-06 LAB — OB RESULTS CONSOLE RPR: RPR: NONREACTIVE

## 2020-04-06 LAB — OB RESULTS CONSOLE GBS: GBS: NEGATIVE

## 2020-04-07 ENCOUNTER — Other Ambulatory Visit: Payer: Self-pay | Admitting: Obstetrics and Gynecology

## 2020-04-07 NOTE — Progress Notes (Signed)
Dating: EDD: 05/04/20  by LMP: unknown and c/w [place] at 6.0 wks.   Preg c/b: 1. Petit Mal Seizures  Referral sent to MFM   Neurologist: Ms Lindi Adie NP  Last appt 04/28/20  Taking Lamictal 150mg  BID  10/10/19 - Levels: 5.3  01/09/20 - Levels: 1.7 - per Ms 01/11/20 - I would continue at current dose if seizures remain controlled. Encourage compliance   Need q trimester levels  Taking Folic Acid 4 mg   AFP test Lindi Adie weeks  2. H/o polyhydramnios - G1 3. 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 Cholelithiasis   Gallbladder u/s repeat 01/01/2020 - Results: Cholelithiasis without complicating factors  LFTs WNL   Amylase and Lipase 01/09/20: both normal  5. Size>dates at [redacted]w[redacted]d  Growth [redacted]w[redacted]d done 03/04/20: reassuring and normal   Prenatal Labs: Blood type/Rh A pos  Antibody screen neg  Rubella Immune  Varicella Immune  RPR NR  HBsAg Neg  HIV NR  GC neg  Chlamydia neg  Genetic screening negative  1 hour GTT 91  3 hour GTT  n/a  GBS  pending

## 2020-04-27 ENCOUNTER — Encounter: Payer: Self-pay | Admitting: Obstetrics and Gynecology

## 2020-04-27 ENCOUNTER — Inpatient Hospital Stay
Admission: EM | Admit: 2020-04-27 | Discharge: 2020-04-28 | DRG: 806 | Disposition: A | Discharge status: Home / Self Care | Payer: 59 | Attending: Obstetrics and Gynecology | Admitting: Obstetrics and Gynecology

## 2020-04-27 ENCOUNTER — Other Ambulatory Visit: Payer: Self-pay

## 2020-04-27 DIAGNOSIS — K219 Gastro-esophageal reflux disease without esophagitis: Secondary | ICD-10-CM | POA: Diagnosis present

## 2020-04-27 DIAGNOSIS — Z87891 Personal history of nicotine dependence: Secondary | ICD-10-CM

## 2020-04-27 DIAGNOSIS — O9081 Anemia of the puerperium: Secondary | ICD-10-CM | POA: Diagnosis not present

## 2020-04-27 DIAGNOSIS — K8020 Calculus of gallbladder without cholecystitis without obstruction: Secondary | ICD-10-CM | POA: Diagnosis present

## 2020-04-27 DIAGNOSIS — Z3A39 39 weeks gestation of pregnancy: Secondary | ICD-10-CM

## 2020-04-27 DIAGNOSIS — O2662 Liver and biliary tract disorders in childbirth: Secondary | ICD-10-CM | POA: Diagnosis present

## 2020-04-27 DIAGNOSIS — D62 Acute posthemorrhagic anemia: Secondary | ICD-10-CM | POA: Diagnosis not present

## 2020-04-27 DIAGNOSIS — Z20822 Contact with and (suspected) exposure to covid-19: Secondary | ICD-10-CM | POA: Diagnosis present

## 2020-04-27 DIAGNOSIS — Z8614 Personal history of Methicillin resistant Staphylococcus aureus infection: Secondary | ICD-10-CM | POA: Diagnosis not present

## 2020-04-27 DIAGNOSIS — O9962 Diseases of the digestive system complicating childbirth: Secondary | ICD-10-CM | POA: Diagnosis present

## 2020-04-27 DIAGNOSIS — O99354 Diseases of the nervous system complicating childbirth: Secondary | ICD-10-CM | POA: Diagnosis present

## 2020-04-27 DIAGNOSIS — O0993 Supervision of high risk pregnancy, unspecified, third trimester: Secondary | ICD-10-CM

## 2020-04-27 DIAGNOSIS — G40A09 Absence epileptic syndrome, not intractable, without status epilepticus: Secondary | ICD-10-CM | POA: Diagnosis present

## 2020-04-27 DIAGNOSIS — O26893 Other specified pregnancy related conditions, third trimester: Secondary | ICD-10-CM | POA: Diagnosis present

## 2020-04-27 LAB — RPR: RPR Ser Ql: NONREACTIVE

## 2020-04-27 LAB — TYPE AND SCREEN
ABO/RH(D): A POS
Antibody Screen: NEGATIVE

## 2020-04-27 LAB — CBC
HCT: 29.6 % — ABNORMAL LOW (ref 36.0–46.0)
Hemoglobin: 10.1 g/dL — ABNORMAL LOW (ref 12.0–15.0)
MCH: 27.6 pg (ref 26.0–34.0)
MCHC: 34.1 g/dL (ref 30.0–36.0)
MCV: 80.9 fL (ref 80.0–100.0)
Platelets: 219 10*3/uL (ref 150–400)
RBC: 3.66 MIL/uL — ABNORMAL LOW (ref 3.87–5.11)
RDW: 15.1 % (ref 11.5–15.5)
WBC: 8.9 10*3/uL (ref 4.0–10.5)
nRBC: 0 % (ref 0.0–0.2)

## 2020-04-27 LAB — SARS CORONAVIRUS 2 BY RT PCR (HOSPITAL ORDER, PERFORMED IN ~~LOC~~ HOSPITAL LAB): SARS Coronavirus 2: NEGATIVE

## 2020-04-27 MED ORDER — SENNOSIDES-DOCUSATE SODIUM 8.6-50 MG PO TABS
2.0000 | ORAL_TABLET | ORAL | Status: DC
  Administered 2020-04-27: 2 via ORAL
  Filled 2020-04-27: qty 2

## 2020-04-27 MED ORDER — OXYTOCIN-SODIUM CHLORIDE 30-0.9 UT/500ML-% IV SOLN
1.0000 m[IU]/min | INTRAVENOUS | Status: DC
  Administered 2020-04-27: 2 m[IU]/min via INTRAVENOUS
  Filled 2020-04-27: qty 500

## 2020-04-27 MED ORDER — ACETAMINOPHEN 325 MG PO TABS
650.0000 mg | ORAL_TABLET | ORAL | Status: DC | PRN
  Filled 2020-04-27: qty 2

## 2020-04-27 MED ORDER — OXYTOCIN-SODIUM CHLORIDE 30-0.9 UT/500ML-% IV SOLN
2.5000 [IU]/h | INTRAVENOUS | Status: DC
  Filled 2020-04-27: qty 500

## 2020-04-27 MED ORDER — PRENATAL MULTIVITAMIN CH
1.0000 | ORAL_TABLET | Freq: Every day | ORAL | Status: DC
  Administered 2020-04-28: 1 via ORAL
  Filled 2020-04-27: qty 1

## 2020-04-27 MED ORDER — OXYTOCIN 10 UNIT/ML IJ SOLN
INTRAMUSCULAR | Status: AC
  Filled 2020-04-27: qty 2

## 2020-04-27 MED ORDER — OXYCODONE HCL 5 MG PO TABS: 5.0000 mg | ORAL_TABLET | ORAL | Status: DC | PRN

## 2020-04-27 MED ORDER — IBUPROFEN 600 MG PO TABS
600.0000 mg | ORAL_TABLET | Freq: Four times a day (QID) | ORAL | Status: DC
  Administered 2020-04-28 (×3): 600 mg via ORAL
  Filled 2020-04-27 (×3): qty 1

## 2020-04-27 MED ORDER — SODIUM CHLORIDE 0.9% FLUSH: 3.0000 mL | Freq: Two times a day (BID) | INTRAVENOUS | Status: DC

## 2020-04-27 MED ORDER — LIDOCAINE HCL (PF) 1 % IJ SOLN
INTRAMUSCULAR | Status: AC
  Filled 2020-04-27: qty 30

## 2020-04-27 MED ORDER — ZOLPIDEM TARTRATE 5 MG PO TABS: 5.0000 mg | ORAL_TABLET | Freq: Every evening | ORAL | Status: DC | PRN

## 2020-04-27 MED ORDER — OXYTOCIN BOLUS FROM INFUSION: 333.0000 mL | Freq: Once | INTRAVENOUS | Status: DC

## 2020-04-27 MED ORDER — COCONUT OIL OIL: 1.0000 "application " | TOPICAL_OIL | Status: DC | PRN

## 2020-04-27 MED ORDER — ONDANSETRON HCL 4 MG PO TABS
4.0000 mg | ORAL_TABLET | ORAL | Status: DC | PRN
  Filled 2020-04-27: qty 1

## 2020-04-27 MED ORDER — DIPHENHYDRAMINE HCL 25 MG PO CAPS: 25.0000 mg | ORAL_CAPSULE | Freq: Four times a day (QID) | ORAL | Status: DC | PRN

## 2020-04-27 MED ORDER — MEASLES, MUMPS & RUBELLA VAC IJ SOLR
0.5000 mL | Freq: Once | INTRAMUSCULAR | Status: DC
  Filled 2020-04-27: qty 0.5

## 2020-04-27 MED ORDER — METHYLPHENIDATE HCL ER (CD) 60 MG PO CPCR: 60.0000 mg | ORAL_CAPSULE | Freq: Every day | ORAL | Status: DC

## 2020-04-27 MED ORDER — SIMETHICONE 80 MG PO CHEW: 80.0000 mg | CHEWABLE_TABLET | ORAL | Status: DC | PRN

## 2020-04-27 MED ORDER — AMMONIA AROMATIC IN INHA
RESPIRATORY_TRACT | Status: AC
  Filled 2020-04-27: qty 10

## 2020-04-27 MED ORDER — ACETAMINOPHEN 325 MG PO TABS
650.0000 mg | ORAL_TABLET | ORAL | Status: DC | PRN
  Administered 2020-04-27 – 2020-04-28 (×5): 650 mg via ORAL
  Filled 2020-04-27 (×4): qty 2

## 2020-04-27 MED ORDER — MISOPROSTOL 200 MCG PO TABS
ORAL_TABLET | ORAL | Status: AC
  Filled 2020-04-27: qty 4

## 2020-04-27 MED ORDER — BENZOCAINE-MENTHOL 20-0.5 % EX AERO
1.0000 "application " | INHALATION_SPRAY | CUTANEOUS | Status: DC | PRN
  Administered 2020-04-27: 1 via TOPICAL
  Filled 2020-04-27: qty 56

## 2020-04-27 MED ORDER — LACTATED RINGERS IV SOLN: INTRAVENOUS | Status: DC

## 2020-04-27 MED ORDER — ALPRAZOLAM 0.5 MG PO TABS: 0.5000 mg | ORAL_TABLET | Freq: Every day | ORAL | Status: DC | PRN

## 2020-04-27 MED ORDER — ONDANSETRON HCL 4 MG/2ML IJ SOLN: 4.0000 mg | INTRAMUSCULAR | Status: DC | PRN

## 2020-04-27 MED ORDER — TERBUTALINE SULFATE 1 MG/ML IJ SOLN: 0.2500 mg | Freq: Once | INTRAMUSCULAR | Status: DC | PRN

## 2020-04-27 MED ORDER — ONDANSETRON HCL 4 MG/2ML IJ SOLN
4.0000 mg | Freq: Four times a day (QID) | INTRAMUSCULAR | Status: DC | PRN
  Administered 2020-04-27: 4 mg via INTRAVENOUS
  Filled 2020-04-27: qty 2

## 2020-04-27 MED ORDER — DIBUCAINE (PERIANAL) 1 % EX OINT: 1.0000 "application " | TOPICAL_OINTMENT | CUTANEOUS | Status: DC | PRN

## 2020-04-27 MED ORDER — OXYTOCIN 10 UNIT/ML IJ SOLN
10.0000 [IU] | Freq: Once | INTRAMUSCULAR | Status: AC
  Administered 2020-04-27: 10 [IU] via INTRAMUSCULAR

## 2020-04-27 MED ORDER — LACTATED RINGERS IV SOLN: 500.0000 mL | INTRAVENOUS | Status: DC | PRN

## 2020-04-27 MED ORDER — MISOPROSTOL 25 MCG QUARTER TABLET
25.0000 ug | ORAL_TABLET | ORAL | Status: DC | PRN
  Administered 2020-04-27: 25 ug via VAGINAL
  Filled 2020-04-27: qty 1

## 2020-04-27 MED ORDER — TETANUS-DIPHTH-ACELL PERTUSSIS 5-2.5-18.5 LF-MCG/0.5 IM SUSP
0.5000 mL | Freq: Once | INTRAMUSCULAR | Status: DC
  Filled 2020-04-27: qty 0.5

## 2020-04-27 MED ORDER — SOD CITRATE-CITRIC ACID 500-334 MG/5ML PO SOLN: 30.0000 mL | ORAL | Status: DC | PRN

## 2020-04-27 MED ORDER — FLEET ENEMA 7-19 GM/118ML RE ENEM: 1.0000 | ENEMA | Freq: Every day | RECTAL | Status: DC | PRN

## 2020-04-27 MED ORDER — LIDOCAINE HCL (PF) 1 % IJ SOLN: 30.0000 mL | INTRAMUSCULAR | Status: DC | PRN

## 2020-04-27 MED ORDER — WITCH HAZEL-GLYCERIN EX PADS: 1.0000 "application " | MEDICATED_PAD | CUTANEOUS | Status: DC | PRN

## 2020-04-27 MED ORDER — BISACODYL 10 MG RE SUPP
10.0000 mg | Freq: Every day | RECTAL | Status: DC | PRN
  Filled 2020-04-27: qty 1

## 2020-04-27 MED ORDER — ONDANSETRON HCL 4 MG PO TABS
4.0000 mg | ORAL_TABLET | Freq: Three times a day (TID) | ORAL | Status: DC | PRN
  Filled 2020-04-27: qty 1

## 2020-04-27 MED ORDER — SODIUM CHLORIDE 0.9 % IV SOLN: 250.0000 mL | INTRAVENOUS | Status: DC | PRN

## 2020-04-27 MED ORDER — SODIUM CHLORIDE 0.9% FLUSH: 3.0000 mL | INTRAVENOUS | Status: DC | PRN

## 2020-04-27 MED ORDER — LAMOTRIGINE 25 MG PO TABS
150.0000 mg | ORAL_TABLET | Freq: Two times a day (BID) | ORAL | Status: DC
  Administered 2020-04-27 – 2020-04-28 (×2): 150 mg via ORAL
  Filled 2020-04-27 (×5): qty 2

## 2020-04-27 MED ORDER — IBUPROFEN 600 MG PO TABS
600.0000 mg | ORAL_TABLET | Freq: Four times a day (QID) | ORAL | Status: DC
  Administered 2020-04-27: 600 mg via ORAL
  Filled 2020-04-27: qty 1

## 2020-04-27 MED ORDER — BUTORPHANOL TARTRATE 1 MG/ML IJ SOLN
1.0000 mg | INTRAMUSCULAR | Status: DC | PRN
  Administered 2020-04-27: 1 mg via INTRAVENOUS
  Filled 2020-04-27: qty 1

## 2020-04-27 MED ORDER — CALCIUM CARBONATE ANTACID 500 MG PO CHEW: 1.0000 | CHEWABLE_TABLET | ORAL | Status: DC | PRN

## 2020-04-27 NOTE — Lactation Note (Signed)
This note was copied from a baby's chart. Lactation Consultation Note  Patient Name: Katie Marks WIOXB'D Date: 04/27/2020   Mom's original feeding choice was to breast/formula feed.  Before she delivered, she had changed that to just formula.  After she delivered, she decided to put him to the breast for short time and then wanted to give a bottle of formula.  She reports that she will probably just do bottles of formula.  LC may want to follow up with her again tomorrow to confirm.  Maternal Data Formula Feeding for Exclusion: Yes Reason for exclusion: Mother's choice to formula feed on admision  Feeding    LATCH Score                   Interventions    Lactation Tools Discussed/Used     Consult Status      Louis Meckel 04/27/2020, 10:52 PM

## 2020-04-27 NOTE — H&P (Signed)
OB History & Physical   History of Present Illness:  Chief Complaint:   HPI:  Katie Marks is a 34 y.o. G35P2002 female at [redacted]w[redacted]d dated by 6 week u/s.  She presents to L&D for IOL.  She reports:  -active fetal movement -no leakage of fluid  -no vaginal bleeding -no contractions  Pregnancy Issues: 1. Petit Mal Seizures   Taking Lamictal 150mg  BID   10/10/19 - Levels: 5.3   01/09/20 - Levels: 1.7  2. H/o polyhydramnios - G1 3. 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 Cholelithiasis    Gallbladder u/s 01/01/2020 - Cholelithiasis without complicating   Maternal Medical History:   Past Medical History:  Diagnosis Date  . ADHD (attention deficit hyperactivity disorder)   . Anemia    H/O  . GERD (gastroesophageal reflux disease)    OCC-TUMS PRN  . History of methicillin resistant staphylococcus aureus (MRSA) 2012  . History of panic attacks   . Panic attacks   . Petit mal ([facility])   . Seizures ([facility]) May 20, 2015   petit mal--no medication at present-PT STATES SHE HAS PETIT MAL SEIZURES DAILY AND THEY LAST FOR 1-2 SEC    Past Surgical History:  Procedure Laterality Date  . IUD REMOVAL N/A 05/25/2015   Procedure: INTRAUTERINE DEVICE (IUD) REMOVAL, RIGHT OVARIAN CYSTOTOMY ;  Surgeon: 07/25/2015, MD;  Location: [facility] ORS;  Service: Gynecology;  Laterality: N/A;  . LAPAROSCOPY N/A 05/25/2015   Procedure: LAPAROSCOPY DIAGNOSTIC;  Surgeon: 07/25/2015, MD;  Location: [facility] ORS;  Service: Gynecology;  Laterality: N/A;  . NO PAST SURGERIES    . OVARIAN CYST REMOVAL      Allergies  Allergen Reactions  . Morphine Anxiety    Prior to Admission medications   Medication Sig Start Date End Date Taking? Authorizing Provider  lamoTRIgine (LAMICTAL) 150 MG tablet Take 150 mg by mouth 2 (two) times daily.   Yes [provider]  Prenatal Vit-Fe Fumarate-FA (PRENATAL MULTIVITAMIN) TABS tablet Take 1 tablet by mouth daily at 12 noon.   Yes  [provider]  ALPRAZolam Suzy Bouchard) 0.5 MG tablet Take 0.5 mg by mouth daily as needed for anxiety.     [provider]  calcium carbonate (TUMS) 500 MG chewable tablet Chew 1-2 tablets by mouth as needed for indigestion or heartburn.     [provider]  cefUROXime (CEFTIN) 250 MG tablet Take 250 mg by mouth 2 (two) times daily with a meal. For 10 days 06/09/17   [provider]  methylphenidate (METADATE CD) 60 MG CR capsule Take 60 mg by mouth daily.  04/20/17   [provider]  naproxen sodium (ANAPROX) 220 MG tablet Take 220 mg by mouth 2 (two) times daily as needed (for pain or headache).    [provider]  ondansetron (ZOFRAN) 4 MG tablet Take 1 tablet (4 mg total) by mouth every 8 (eight) hours as needed for nausea or vomiting. Patient not taking: Reported on 05/16/2017 05/14/17   05/16/17, MD     Prenatal care site: [facility]   Social History: She  reports that she has quit smoking. Her smoking use included cigarettes. She quit after 5.00 years of use. She has never used smokeless tobacco. She reports current alcohol use. She reports that she does not use drugs.   Family History: family history includes Hypertension in her father.   Review of Systems: A full review of systems was performed and negative except  as noted in the HPI.    Physical Exam:  Vital Signs: BP 121/74   Pulse 77   Temp 97.8 F (36.6 C) (Oral)   Resp 18   Ht 5\' 6"  (1.676 m)   Wt 106.6 kg   BMI 37.93 kg/m   General:   alert and cooperative  Skin:  normal  Neurologic:    Alert & oriented x 3  Lungs:   nl effort  Heart:   regular rate and rhythm  Abdomen:  soft, non-tender; bowel sounds normal; no masses,  no organomegaly  Extremities: : non-tender, symmetric    EFW: 03/03/2020: Efw=4lb9oz (2066g)=68%  Results for orders placed or performed during the hospital encounter of 04/27/20 (from the past 24 hour(s))  CBC     Status:  Abnormal   Collection Time: 04/27/20  5:24 AM  Result Value Ref Range   WBC 8.9 4.0 - 10.5 K/uL   RBC 3.66 (L) 3.87 - 5.11 MIL/uL   Hemoglobin 10.1 (L) 12.0 - 15.0 g/dL   HCT 04/29/20 (L) 36 - 46 %   MCV 80.9 80.0 - 100.0 fL   MCH 27.6 26.0 - 34.0 pg   MCHC 34.1 30.0 - 36.0 g/dL   RDW 98.3 38.2 - 50.5 %   Platelets 219 150 - 400 K/uL   nRBC 0.0 0.0 - 0.2 %  RPR     Status: None   Collection Time: 04/27/20  5:24 AM  Result Value Ref Range   RPR Ser Ql NON REACTIVE NON REACTIVE  Type and screen     Status: None   Collection Time: 04/27/20  5:24 AM  Result Value Ref Range   ABO/RH(D) A POS    Antibody Screen NEG    Sample Expiration      04/30/2020,2359 Performed at [facility], [address] [place], [place]   SARS Coronavirus 2 by RT PCR (hospital order, performed in [facility] hospital lab) Nasopharyngeal Nasopharyngeal Swab     Status: None   Collection Time: 04/27/20  5:24 AM   Specimen: Nasopharyngeal Swab  Result Value Ref Range   SARS Coronavirus 2 NEGATIVE NEGATIVE    Pertinent Results:  Prenatal Labs: Blood type/Rh A pos  Antibody screen neg  Rubella Immune  Varicella Immune  RPR NR  HBsAg Neg  HIV NR  GC neg  Chlamydia neg  Genetic screening negative  1 hour GTT 91  3 hour GTT  n/a  GBS  neg    FHT: FHR: 120 bpm, variability: moderate,  accelerations:  Present,  decelerations:  Absent Category/reactivity:  Category I TOCO: irregular SVE: Dilation: 3 / Effacement (%): 60 / Station: -2     Assessment:  Katie Marks is a 34 y.o. G48P2002 female at [redacted]w[redacted]d here for IOL.  Plan:  1. Admit to Labor & Delivery; consents reviewed and obtained  2. Fetal Well being  - Fetal Tracing: Cat I - GBS Neg  3. Routine OB: - Prenatal labs reviewed, as above - Rh pos - CBC & T&S on admit - Clear fluids, IVF  4. Induction of Labor -  Contractions by external toco in place -  Pelvis proven to 3720g -  Plan for induction with cytotec -   Plan for continuous fetal monitoring  -  Maternal pain control as desired: IVPM, nitrous, regional anesthesia - Anticipate vaginal delivery  5. Post Partum Planning: - Infant feeding: will try BFing - Contraception: vasectomy  [redacted]w[redacted]d, CNM 04/27/2020 12:12 PM

## 2020-04-27 NOTE — Discharge Summary (Signed)
Obstetrical Discharge Summary  Patient Name: Katie Marks DOB: 03/03/1986 MRN: [id number]  Date of Admission: 04/27/2020 Date of Discharge: 04/28/2020  Primary OB: [facility]  Gestational Age at Delivery: [redacted]w[redacted]d   Antepartum complications:   1. Petit Mal Seizures              Taking Lamictal 150mg  BID              10/10/19 - Levels: 5.3              01/09/20 - Levels: 1.7  2. H/o polyhydramnios - G1 3. 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 Cholelithiasis               Gallbladder u/s 01/01/2020 - Cholelithiasis without complications  Admitting Diagnosis:  Secondary Diagnosis: Patient Active Problem List   Diagnosis Date Noted  . Supervision of high risk pregnancy in third trimester 04/27/2020  . Abdominal pain during pregnancy, second trimester 12/28/2019  . Indication for care in labor or delivery 01/15/2017  . First trimester screening   . Labor and delivery, indication for care 01/28/2015  . Low back pain during pregnancy in third trimester 12/24/2014    Augmentation: AROM, Pitocin and Cytotec Complications: None Intrapartum complications/course:  NSVD  Date of Delivery: 04/27/20 Delivered By: 04/29/20   Delivery Type: spontaneous vaginal delivery Anesthesia: none Placenta: Spontaneous Laceration: 1s deg Episiotomy: none Newborn Data: Live born female "Katie Marks" Birth Weight:   APGAR: 7, 9  Newborn Delivery   Birth date/time: 04/27/2020 17:42:00 Delivery type: Vaginal, Spontaneous       Brief Hospital Course  Katie Marks is a Katie Marks who had a SVD on 04/27/20;  for further details of this delivery, please refer to the delivery note.  Patient had an uncomplicated postpartum course.  By time of discharge on PPD#1, her pain was controlled on oral pain medications; she had appropriate lochia and was ambulating, voiding without difficulty and tolerating regular diet.  She was deemed stable for discharge to home.    Discharge  Physical Exam:  BP 114/72 (BP Location: Left Arm)   Pulse 69   Temp 98 F (36.7 C)   Resp 20   Ht 5\' 6"  (1.676 m)   Wt 106.6 kg   SpO2 100%   Breastfeeding Unknown   BMI 37.93 kg/m   General: NAD CV: RRR Pulm: CTABL, nl effort ABD: s/nd/nt, fundus firm and below the umbilicus Lochia: moderate DVT Evaluation: LE non-ttp, no evidence of DVT on exam.  Hemoglobin  Date Value Ref Range Status  04/28/2020 9.0 (L) 12.0 - 15.0 g/dL Final   HCT  Date Value Ref Range Status  04/28/2020 27.2 (L) 36 - 46 % Final    Post partum course: uncomplicated  Postpartum Procedures: none  Edinburgh:  Edinburgh Postnatal Depression Scale Screening Tool 04/28/2020 01/17/2017  I have been able to laugh and see the funny side of things. 0 0  I have looked forward with enjoyment to things. 0 0  I have blamed myself unnecessarily when things went wrong. 2 0  I have been anxious or worried for no good reason. 2 0  I have felt scared or panicky for no good reason. 2 0  Things have been getting on top of me. 2 0  I have been so unhappy that I have had difficulty sleeping. 1 0  I have felt sad or miserable. 1 0  I have been so unhappy that I have been crying. 1 0  The thought of harming myself has occurred to me. 0 0  Edinburgh Postnatal Depression Scale Total 11 0    Disposition: stable, discharge to home. Baby Feeding: breastmilk Baby Disposition: home with mom  Rh Immune globulin given: n/a Rubella vaccine given: immune  Flu vaccine given in AP or PP setting: declined Tdap vaccine given in AP or PP setting: declined   Contraception: vasectomy  Prenatal Labs: Blood type/Rh --/--/A POS (09/13 0524)  Antibody screen neg  Rubella Immune  Varicella Immune  RPR NR  HBsAg Neg  HIV NR  GC neg  Chlamydia neg  Genetic screening negative  1 hour GTT 91  3 hour GTT n/a  GBS neg     Plan:  Katie Marks was discharged to home in good condition. Follow-up appointment at [facility]N  with delivering provider in 6 weeks   Discharge Medications: Allergies as of 04/28/2020      Reactions   Morphine Anxiety      Medication List    STOP taking these medications   cefUROXime 250 MG tablet Commonly known as: CEFTIN   methylphenidate 60 MG CR capsule Commonly known as: METADATE CD   naproxen sodium 220 MG tablet Commonly known as: ALEVE   ondansetron 4 MG tablet Commonly known as: Zofran   Tums 500 MG chewable tablet Generic drug: calcium carbonate     TAKE these medications   acetaminophen 325 MG tablet Commonly known as: Tylenol Take 2 tablets (650 mg total) by mouth every 4 (four) hours as needed (for pain scale < 4).   ALPRAZolam 0.5 MG tablet Commonly known as: XANAX Take 0.5 mg by mouth daily as needed for anxiety.   ibuprofen 600 MG tablet Commonly known as: ADVIL Take 1 tablet (600 mg total) by mouth every 6 (six) hours.   lamoTRIgine 150 MG tablet Commonly known as: LAMICTAL Take 150 mg by mouth 2 (two) times daily.   prenatal multivitamin Tabs tablet Take 1 tablet by mouth daily at 12 noon.   senna-docusate 8.6-50 MG tablet Commonly known as: Senokot-S Take 2 tablets by mouth daily. Start taking on: April 29, 2020        Follow-up Information    Christeen Douglas, MD Follow up in 6 week(s).   Specialty: Obstetrics and Gynecology Why: postpartum appointment Contact information: [address] [place] [address] [phone]               Signed: Randa Ngo, CNM 04/28/2020 5:42 PM

## 2020-04-27 NOTE — Progress Notes (Signed)
Katie Marks is a 34 y.o. G3P2002 at [redacted]w[redacted]d by LMP admitted for induction of labor due to Elective at term.  Subjective: Doing well, pitocin at 75mu/min,   Objective: BP 119/82   Pulse 86   Temp 97.9 F (36.6 C)   Resp 18   Ht 5\' 6"  (1.676 m)   Wt 106.6 kg   BMI 37.93 kg/m  No intake/output data recorded. No intake/output data recorded.  FHT:  FHR: 135 bpm, variability: moderate,  accelerations:  Present,  decelerations:  Absent UC:   regular, every 3 minutes SVE:   Dilation: 4 Effacement (%): 70 Station: -1 Exam by:: [email or web address] RN  Labs: Lab Results  Component Value Date   WBC 8.9 04/27/2020   HGB 10.1 (L) 04/27/2020   HCT 29.6 (L) 04/27/2020   MCV 80.9 04/27/2020   PLT 219 04/27/2020    Assessment / Plan: Induction of labor due to elective at term,  progressing well on pitocin  Labor: continue pitocin, AROM now for clear fluid, IUPC placed Fetal Wellbeing:  Category I Pain Control:  Labor support without medications I/D:  n/a Anticipated MOD:  NSVD   Largest baby >8#. Baby boy "Katie Marks"  04/29/2020 04/27/2020, 4:04 PM

## 2020-04-28 LAB — CBC
HCT: 27.2 % — ABNORMAL LOW (ref 36.0–46.0)
Hemoglobin: 9 g/dL — ABNORMAL LOW (ref 12.0–15.0)
MCH: 27.5 pg (ref 26.0–34.0)
MCHC: 33.1 g/dL (ref 30.0–36.0)
MCV: 83.2 fL (ref 80.0–100.0)
Platelets: 203 10*3/uL (ref 150–400)
RBC: 3.27 MIL/uL — ABNORMAL LOW (ref 3.87–5.11)
RDW: 15.2 % (ref 11.5–15.5)
WBC: 10.5 10*3/uL (ref 4.0–10.5)
nRBC: 0 % (ref 0.0–0.2)

## 2020-04-28 MED ORDER — IBUPROFEN 600 MG PO TABS: 600.0000 mg | ORAL_TABLET | Freq: Four times a day (QID) | ORAL | 0 refills | Status: AC

## 2020-04-28 MED ORDER — ACETAMINOPHEN 325 MG PO TABS: 650.0000 mg | ORAL_TABLET | ORAL | 0 refills | Status: AC | PRN

## 2020-04-28 MED ORDER — SENNOSIDES-DOCUSATE SODIUM 8.6-50 MG PO TABS: 2.0000 | ORAL_TABLET | ORAL | 0 refills | Status: AC

## 2020-04-28 NOTE — Progress Notes (Signed)
Pt discharged with infant.  Discharge instructions, prescriptions and follow up appointment given to and reviewed with pt. Pt verbalized understanding. Escorted out by auxillary. 

## 2020-04-28 NOTE — Progress Notes (Signed)
Post Partum Day 1 Subjective: Doing well, no complaints.  Tolerating regular diet, pain with PO meds, voiding and ambulating without difficulty.  No CP SOB Fever,Chills, N/V or leg pain; denies nipple or breast pain. no HA change of vision, RUQ/epigastric pain  Objective: BP 108/71 (BP Location: Left Arm)   Pulse 62   Temp 98 F (36.7 C)   Resp 18   Ht 5\' 6"  (1.676 m)   Wt 106.6 kg   SpO2 100%   Breastfeeding Unknown   BMI 37.93 kg/m    Physical Exam:  General: NAD Breasts: soft/nontender CV: RRR Pulm: nl effort, CTABL Abdomen: soft, NT, BS x 4 Perineum: minimal edema, lacerations hemostatic and repair well approximated Lochia: moderate  Uterine Fundus: fundus firm and 1 fb below umbilicus DVT Evaluation: no cords, ttp LEs   Recent Labs    04/27/20 0524 04/28/20 0654  HGB 10.1* 9.0*  HCT 29.6* 27.2*  WBC 8.9 10.5  PLT 219 203    Assessment/Plan: 34 y.o. G3P3003 postpartum day # 1  - Continue routine PP care - Lactation consult.  - plans spouse vasectomy - Acute blood loss anemia - hemodynamically stable and asymptomatic; start po ferrous sulfate BID with stool softeners  - Immunization status: declined flu and tdap, all other imms UTD.     Disposition: DC at 24hrs this eve.     34, CNM 04/28/2020  10:20 AM

## 2020-04-28 NOTE — Clinical Social Work Maternal (Signed)
°CLINICAL SOCIAL WORK MATERNAL/CHILD NOTE ° °Patient Details  °Name: Katie Marks °MRN: [id number] °Date of Birth: 04/20/1986 ° °Date:  04/28/2020 ° °Clinical Social Worker Initiating Note:  Dayshaun Whobrey Date/Time: Initiated:  04/28/20/     ° °Child's Name:  Katie Marks  ° °Biological Parents:  Father, Mother  ° °Need for Interpreter:  None  ° °Reason for Referral:  Other (Comment) (Edinburgh Score of 11)  ° °Address:  [address] °[place] [address]  °  °Phone number:  [phone] (home)    ° °Additional phone number: None. ° °Household Members/Support Persons (HM/SP):   Household Member/Support Person 1, Household Member/Support Person 2, Household Member/Support Person 3 ° ° °HM/SP Name Relationship DOB or Age  °HM/SP -1 Jonathan husband unknown  °HM/SP -2 Gabriel son 5 years  °HM/SP -3 Melina daughter 3 years  °HM/SP -4        °HM/SP -5        °HM/SP -6        °HM/SP -7        °HM/SP -8        ° ° °Natural Supports (not living in the home):  Extended Family, Friends, Immediate Family  ° °Professional Supports:    ° °Employment: Unemployed  ° °Type of Work:    ° °Education:  Other (comment) ([employer])  ° °Homebound arranged:   ° °Financial Resources:  Medicaid  ° °Other Resources:     ° °Cultural/Religious Considerations Which May Impact Care:  None. ° °Strengths:  Ability to meet basic needs  , Compliance with medical plan  , Pediatrician chosen, Home prepared for child  , Understanding of illness  ° °Psychotropic Medications:        ° °Pediatrician:    [place] ° °Pediatrician List:  ° °[place]    °[place]    °[facility] [facility]  °[place]    °[place]    °[place]    ° ° °Pediatrician Fax Number:   ° °Risk Factors/Current Problems:  None  ° °Cognitive State:  Able to Concentrate  , Alert  , Goal Oriented  , Insightful    ° °Mood/Affect:  Calm  , Happy    ° °CSW Assessment: CSW received a consult for MOB's [place] Score. ° °CSW spoke with  RN Kendra prior to meeting with MOB. Per RN, MOB is appropriate and there are no concerns at this time.  ° °CSW met with MOB via phone. Explained CSW's role and reason for referral. ° °MOB reported she is feeling good, but tired post delivery. MOB was appropriate during assessment.  ° °Confirmed contact information for MOB. MOB and Baby will be living with FOB, and her other children (listed above) at discharge.  ° ° MOB plans to use [facility] in [place], [place] for Baby's Medical Care. MOB reported she has a pack and play, car seat (used, unexpired), clothing, diapers, and all other items needed for Baby. MOB reported she has reliable transportation for herself and Baby. MOB denied resource needs at this time.  ° °MOB reported she has panic attacks at times. MOB reported she was taking medications and seeing a Psychiatrist in [place], [place] for this prior to her pregnancy and plans to resume these interventions now that Baby Katie is born. MOB reported she has a good support system and is coping well emotionally at this time. MOB denied SI, HI, or DV. MOB denied the need for mental health support resources at this time, reported she is aware of resources if additional resources   are needed. ° °CSW provided education on PPD and SIDS. MOB verbalized understanding. CSW ecouraged MOB to reach out to her Provider with any questions or needs for support or resources, even after discharge. RN Kendra to print and give information sheets on PPD and SIDS to MOB. ° °MOB denied any needs or questions at this time. CSW encouraged MOB to reach out if any arise prior to discharge.  ° °Please re consult CSW if any additional needs or concerns arise. ° ° ° ° ° ° ° ° °CSW Plan/Description:  No Further Intervention Required/No Barriers to Discharge, Sudden Infant Death Syndrome (SIDS) Education, Perinatal Mood and Anxiety Disorder (PMADs) Education, Other Patient/Family Education  ° ° °Deniz Eskridge E Vernice Mannina, LCSW °04/28/2020, 10:22 AM °

## 2020-04-28 NOTE — Discharge Instructions (Signed)
Postpartum Care After Vaginal Delivery This sheet gives you information about how to care for yourself from the time you deliver your baby to up to 6-12 weeks after delivery (postpartum period). Your health care provider may also give you more specific instructions. If you have problems or questions, contact your health care provider. Follow these instructions at home: Vaginal bleeding  It is normal to have vaginal bleeding (lochia) after delivery. Wear a sanitary pad for vaginal bleeding and discharge. ? During the first week after delivery, the amount and appearance of lochia is often similar to a menstrual period. ? Over the next few weeks, it will gradually decrease to a dry, yellow-brown discharge. ? For most women, lochia stops completely by 4-6 weeks after delivery. Vaginal bleeding can vary from woman to woman.  Change your sanitary pads frequently. Watch for any changes in your flow, such as: ? A sudden increase in volume. ? A change in color. ? Large blood clots.  If you pass a blood clot from your vagina, save it and call your health care provider to discuss. Do not flush blood clots down the toilet before talking with your health care provider.  Do not use tampons or douches until your health care provider says this is safe.  If you are not breastfeeding, your period should return 6-8 weeks after delivery. If you are feeding your child breast milk only (exclusive breastfeeding), your period may not return until you stop breastfeeding. Perineal care  Keep the area between the vagina and the anus (perineum) clean and dry as told by your health care provider. Use medicated pads and pain-relieving sprays and creams as directed.  If you had a cut in the perineum (episiotomy) or a tear in the vagina, check the area for signs of infection until you are healed. Check for: ? More redness, swelling, or pain. ? Fluid or blood coming from the cut or tear. ? Warmth. ? Pus or a bad  smell.  You may be given a squirt bottle to use instead of wiping to clean the perineum area after you go to the bathroom. As you start healing, you may use the squirt bottle before wiping yourself. Make sure to wipe gently.  To relieve pain caused by an episiotomy, a tear in the vagina, or swollen veins in the anus (hemorrhoids), try taking a warm sitz bath 2-3 times a day. A sitz bath is a warm water bath that is taken while you are sitting down. The water should only come up to your hips and should cover your buttocks. Breast care  Within the first few days after delivery, your breasts may feel heavy, full, and uncomfortable (breast engorgement). Milk may also leak from your breasts. Your health care provider can suggest ways to help relieve the discomfort. Breast engorgement should go away within a few days.  If you are breastfeeding: ? Wear a bra that supports your breasts and fits you well. ? Keep your nipples clean and dry. Apply creams and ointments as told by your health care provider. ? You may need to use breast pads to absorb milk that leaks from your breasts. ? You may have uterine contractions every time you breastfeed for up to several weeks after delivery. Uterine contractions help your uterus return to its normal size. ? If you have any problems with breastfeeding, work with your health care provider or lactation consultant.  If you are not breastfeeding: ? Avoid touching your breasts a lot. Doing this can make   your breasts produce more milk. ? Wear a good-fitting bra and use cold packs to help with swelling. ? Do not squeeze out (express) milk. This causes you to make more milk. Intimacy and sexuality  Ask your health care provider when you can engage in sexual activity. This may depend on: ? Your risk of infection. ? How fast you are healing. ? Your comfort and desire to engage in sexual activity.  You are able to get pregnant after delivery, even if you have not had  your period. If desired, talk with your health care provider about methods of birth control (contraception). Medicines  Take over-the-counter and prescription medicines only as told by your health care provider.  If you were prescribed an antibiotic medicine, take it as told by your health care provider. Do not stop taking the antibiotic even if you start to feel better. Activity  Gradually return to your normal activities as told by your health care provider. Ask your health care provider what activities are safe for you.  Rest as much as possible. Try to rest or take a nap while your baby is sleeping. Eating and drinking   Drink enough fluid to keep your urine pale yellow.  Eat high-fiber foods every day. These may help prevent or relieve constipation. High-fiber foods include: ? Whole grain cereals and breads. ? Brown rice. ? Beans. ? Fresh fruits and vegetables.  Do not try to lose weight quickly by cutting back on calories.  Take your prenatal vitamins until your postpartum checkup or until your health care provider tells you it is okay to stop. Lifestyle  Do not use any products that contain nicotine or tobacco, such as cigarettes and e-cigarettes. If you need help quitting, ask your health care provider.  Do not drink alcohol, especially if you are breastfeeding. General instructions  Keep all follow-up visits for you and your baby as told by your health care provider. Most women visit their health care provider for a postpartum checkup within the first 3-6 weeks after delivery. Contact a health care provider if:  You feel unable to cope with the changes that your child brings to your life, and these feelings do not go away.  You feel unusually sad or worried.  Your breasts become red, painful, or hard.  You have a fever.  You have trouble holding urine or keeping urine from leaking.  You have little or no interest in activities you used to enjoy.  You have not  breastfed at all and you have not had a menstrual period for 12 weeks after delivery.  You have stopped breastfeeding and you have not had a menstrual period for 12 weeks after you stopped breastfeeding.  You have questions about caring for yourself or your baby.  You pass a blood clot from your vagina. Get help right away if:  You have chest pain.  You have difficulty breathing.  You have sudden, severe leg pain.  You have severe pain or cramping in your lower abdomen.  You bleed from your vagina so much that you fill more than one sanitary pad in one hour. Bleeding should not be heavier than your heaviest period.  You develop a severe headache.  You faint.  You have blurred vision or spots in your vision.  You have bad-smelling vaginal discharge.  You have thoughts about hurting yourself or your baby. If you ever feel like you may hurt yourself or others, or have thoughts about taking your own life, get help  right away. You can go to the nearest emergency department or call:  Your local emergency services (911 in the [place]  A suicide crisis helpline, such as the National Suicide Prevention Lifeline at 914 100 2361. This is open 24 hours a day. Summary  The period of time right after you deliver your newborn up to 6-12 weeks after delivery is called the postpartum period.  Gradually return to your normal activities as told by your health care provider.  Keep all follow-up visits for you and your baby as told by your health care provider. This information is not intended to replace advice given to you by your health care provider. Make sure you discuss any questions you have with your health care provider. Document Revised: 08/04/2017 Document Reviewed: 05/15/2017 Elsevier Patient Education  2020 [employer]. Breastfeeding Tips for a Good Latch Latching is how your baby's mouth attaches to your nipple to breastfeed. It is an important part of breastfeeding. Your baby  may have trouble latching for a number of reasons. A poor latch may cause you to have cracked or sore nipples or other problems. Follow these instructions at home: How to position your baby  Find a comfortable place to sit or lie down. Your neck and back should be well supported.  If you are seated, place a pillow or rolled-up blanket under your baby. This will bring him or her to the level of your breast.  Make sure that your baby's belly (abdomen) is facing your belly.  Try different positions to find one that works best for you and your baby. How to help your baby latch   To start, gently rub your breast. Move your fingertips in a circle as you massage from your chest wall toward your nipple. This helps milk flow. Keep doing this during feeding if needed.  Position your breast. Hold your breast with four fingers underneath and your thumb above your nipple. Keep your fingers away from your nipple and your baby's mouth. Follow these steps to help your baby latch: 1. Rub your baby's lips gently with your finger or nipple. 2. When your baby's mouth is open wide enough, quickly bring your baby to your breast and place your whole nipple into your baby's mouth. Place as much of the colored area around your nipple (areola)as possible into your baby's mouth. 3. Your baby's tongue should be between his or her lower gum and your breast. 4. You should be able to see more areola above your baby's upper lip than below the lower lip. 5. When your baby starts sucking, you will feel a gentle pull on your nipple. You should not feel any pain. Be patient. It is common for a baby to suck for about 2-3 minutes to start the flow of breast milk. 6. Make sure that your baby's mouth is in the right position around your nipple. Your baby's lips should make a seal on your breast and be turned outward.  General instructions  Look for these signs that your baby has latched on to your nipple: ? The baby is quietly  tugging or sucking without causing you pain. ? You hear the baby swallow after every 3 or 4 sucks. ? You see movement above and in front of the baby's ears while he or she is sucking.  Be aware of these signs that your baby has not latched on to your nipple: ? The baby makes sucking sounds or smacking sounds while feeding. ? You have nipple pain.  If your  baby is not latched well, put your little finger between your baby's gums and your nipple. This will break the seal. Then try to help your baby latch again.  If you keep having problems, get help from a breastfeeding specialist (lactation consultant). Contact a doctor if:  You have cracking or soreness in your nipples that lasts longer than 1 week.  You have nipple pain.  Your breasts are filled with too much milk (engorgement), and this does not improve after 48-72 hours.  You have a plugged milk duct and a fever.  You follow the tips for a good latch but you keep having problems or concerns.  You have a pus-like fluid coming from your breast.  Your baby is not gaining weight.  Your baby loses weight. Summary  Latching is how your baby's mouth attaches to your nipple to breastfeed.  Try different positions for breastfeeding to find one that works best for you and your baby.  A poor latch may cause you to have cracked or sore nipples or other problems. This information is not intended to replace advice given to you by your health care provider. Make sure you discuss any questions you have with your health care provider. Document Revised: 11/21/2018 Document Reviewed: 03/08/2017 Elsevier Patient Education  2020 Elsevier Inc.  

## 2021-01-13 ENCOUNTER — Other Ambulatory Visit: Payer: Self-pay

## 2021-01-13 ENCOUNTER — Telehealth: Payer: Self-pay

## 2021-01-13 MED ORDER — VALACYCLOVIR HCL 500 MG PO TABS: 500.0000 mg | ORAL_TABLET | Freq: Two times a day (BID) | ORAL | 0 refills | Status: AC

## 2021-01-13 NOTE — Telephone Encounter (Signed)
Patient advised Rx sent in to start prior to lip filler, Valtrex 500mg  take 1 po BID x 7 days, starting 1 day prior to procedure dsp #14 0Rf to [facility] [place].

## 2021-01-18 ENCOUNTER — Other Ambulatory Visit: Payer: Self-pay

## 2021-01-18 ENCOUNTER — Ambulatory Visit (INDEPENDENT_AMBULATORY_CARE_PROVIDER_SITE_OTHER): Payer: 59 | Admitting: Dermatology

## 2021-01-18 DIAGNOSIS — L988 Other specified disorders of the skin and subcutaneous tissue: Secondary | ICD-10-CM

## 2021-01-18 NOTE — Progress Notes (Signed)
   New Patient Visit  Subjective  Katie Marks is a 35 y.o. female who presents for the following: Cosmetic Consult (New patient presents today for [facility] and Botox consult. Patient has not had Botox of filler in the past.). She states she wants a very full lip like Rudi Heap  The following portions of the chart were reviewed this encounter and updated as appropriate:       Review of Systems:  No other skin or systemic complaints except as noted in HPI or Assessment and Plan.  Objective  Well appearing patient in no apparent distress; mood and affect are within normal limits.  A focused examination was performed including face. Relevant physical exam findings are noted in the Assessment and Plan.  Objective  Frown complex, forehead, lips: Rhytides and volume loss.   Images                         Assessment & Plan  Elastosis of skin Frown complex, forehead, lips  Botox and Restylane Refyne as marked.  Botox 25 units - frown complex 20 units, forehead 5 units    Botox Injection - Frown complex, forehead, lips Location: See attached image  Informed consent: Discussed risks (infection, pain, bleeding, bruising, swelling, allergic reaction, paralysis of nearby muscles, eyelid droop, double vision, neck weakness, difficulty breathing, headache, undesirable cosmetic result, and need for additional treatment) and benefits of the procedure, as well as the alternatives.  Informed consent was obtained.  Preparation: The area was cleansed with alcohol.  Procedure Details:  Botox was injected into the dermis with a 30-gauge needle. Pressure applied to any bleeding. Ice packs offered for swelling.  Lot Number:  [id number] Expiration:  12/2022  Total Units Injected:  25  Plan: Patient was instructed to remain upright for 4 hours. Patient was instructed to avoid massaging the face and avoid vigorous exercise for the rest of the day. Tylenol may be used for  headache.  Allow 2 weeks before returning to clinic for additional dosing as needed. Patient will call for any problems.   Filling material injection - Frown complex, forehead, lips Prior to the procedure, the patient's past medical history, allergies and the rare but potential risks and complications were reviewed with the patient and a signed consent was obtained. Pre and post-treatment care was discussed and instructions provided.  Location: lips  Filler Type: Restylane refyne  Procedure: Lidocaine-tetracaine ointment was applied to treatment areas to achieve good local anesthesia. The area was prepped thoroughly with hibiclens After introducing the needle into the desired treatment area, the syringe plunger was drawn back to ensure there was no flash of blood prior to injecting the filler in order to minimize risk of intravascular injection and vascular occlusion.    Patient tolerated the procedure well. The patient will call with any problems, questions or concerns prior to their next appointment.   Return in about 2 weeks (around 02/01/2021) for follow-up Botox and Fillers.   ICherlyn Labella, CMA, am acting as scribe for Willeen Niece, MD .  Documentation: I have reviewed the above documentation for accuracy and completeness, and I agree with the above.  Willeen Niece MD

## 2021-01-18 NOTE — Patient Instructions (Addendum)

## 2021-02-01 ENCOUNTER — Ambulatory Visit: Payer: 59 | Admitting: Dermatology

## 2022-06-29 ENCOUNTER — Other Ambulatory Visit: Payer: Self-pay | Admitting: General Surgery

## 2022-06-29 DIAGNOSIS — R1084 Generalized abdominal pain: Secondary | ICD-10-CM

## 2022-07-12 ENCOUNTER — Ambulatory Visit
Admission: RE | Admit: 2022-07-12 | Discharge: 2022-07-12 | Disposition: A | Discharge status: Home / Self Care | Payer: Medicaid Other | Source: Ambulatory Visit | Attending: General Surgery | Admitting: General Surgery

## 2022-07-12 DIAGNOSIS — R1084 Generalized abdominal pain: Secondary | ICD-10-CM | POA: Diagnosis present

## 2022-07-12 MED ORDER — IOHEXOL 300 MG/ML  SOLN
100.0000 mL | Freq: Once | INTRAMUSCULAR | Status: AC | PRN
  Administered 2022-07-12: 100 mL via INTRAVENOUS
